# Patient Record
Sex: Female | Born: 1980 | Race: White | Hispanic: No | Marital: Married | Smoking: Current every day smoker
Health system: Southern US, Community
[De-identification: ages and names within clinical notes are randomized; demographics above are authoritative.]

## PROBLEM LIST (undated history)

## (undated) DIAGNOSIS — Z803 Family history of malignant neoplasm of breast: Secondary | ICD-10-CM

## (undated) DIAGNOSIS — N920 Excessive and frequent menstruation with regular cycle: Secondary | ICD-10-CM

## (undated) DIAGNOSIS — F988 Other specified behavioral and emotional disorders with onset usually occurring in childhood and adolescence: Secondary | ICD-10-CM

## (undated) HISTORY — DX: Family history of malignant neoplasm of breast: Z80.3

## (undated) HISTORY — PX: LASIK: SHX215

## (undated) HISTORY — DX: Other specified behavioral and emotional disorders with onset usually occurring in childhood and adolescence: F98.8

## (undated) HISTORY — DX: Excessive and frequent menstruation with regular cycle: N92.0

## (undated) HISTORY — PX: TUBAL LIGATION: SHX77

---

## 2005-01-04 ENCOUNTER — Inpatient Hospital Stay: Payer: Self-pay | Admitting: Unknown Physician Specialty

## 2007-04-08 ENCOUNTER — Ambulatory Visit: Payer: Self-pay

## 2007-04-08 HISTORY — PX: DILATION AND CURETTAGE OF UTERUS: SHX78

## 2008-11-29 ENCOUNTER — Ambulatory Visit: Payer: Self-pay | Admitting: Obstetrics & Gynecology

## 2008-11-30 ENCOUNTER — Inpatient Hospital Stay: Payer: Self-pay | Admitting: Obstetrics & Gynecology

## 2016-07-03 LAB — HM PAP SMEAR: HM PAP: NEGATIVE

## 2017-02-22 ENCOUNTER — Ambulatory Visit (INDEPENDENT_AMBULATORY_CARE_PROVIDER_SITE_OTHER): Payer: BC Managed Care – PPO | Admitting: Obstetrics & Gynecology

## 2017-02-22 ENCOUNTER — Encounter: Payer: Self-pay | Admitting: Obstetrics & Gynecology

## 2017-02-22 VITALS — BP 102/70 | HR 75 | Ht 63.0 in | Wt 129.0 lb

## 2017-02-22 DIAGNOSIS — N920 Excessive and frequent menstruation with regular cycle: Secondary | ICD-10-CM | POA: Diagnosis not present

## 2017-02-22 NOTE — Patient Instructions (Signed)
Endometrial Ablation Endometrial ablation is a procedure that destroys the thin inner layer of the lining of the uterus (endometrium). This procedure may be done:  To stop heavy periods.  To stop bleeding that is causing anemia.  To control irregular bleeding.  To treat bleeding caused by small tumors (fibroids) in the endometrium.  This procedure is often an alternative to major surgery, such as removal of the uterus and cervix (hysterectomy). As a result of this procedure:  You may not be able to have children. However, if you are premenopausal (you have not gone through menopause): ? You may still have a small chance of getting pregnant. ? You will need to use a reliable method of birth control after the procedure to prevent pregnancy.  You may stop having a menstrual period, or you may have only a small amount of bleeding during your period. Menstruation may return several years after the procedure.  Tell a health care provider about:  Any allergies you have.  All medicines you are taking, including vitamins, herbs, eye drops, creams, and over-the-counter medicines.  Any problems you or family members have had with the use of anesthetic medicines.  Any blood disorders you have.  Any surgeries you have had.  Any medical conditions you have. What are the risks? Generally, this is a safe procedure. However, problems may occur, including:  A hole (perforation) in the uterus or bowel.  Infection of the uterus, bladder, or vagina.  Bleeding.  Damage to other structures or organs.  An air bubble in the lung (air embolus).  Problems with pregnancy after the procedure.  Failure of the procedure.  Decreased ability to diagnose cancer in the endometrium.  What happens before the procedure?  You will have tests of your endometrium to make sure there are no pre-cancerous cells or cancer cells present.  You may have an ultrasound of the uterus.  You may be given  medicines to thin the endometrium.  Ask your health care provider about: ? Changing or stopping your regular medicines. This is especially important if you take diabetes medicines or blood thinners. ? Taking medicines such as aspirin and ibuprofen. These medicines can thin your blood. Do not take these medicines before your procedure if your doctor tells you not to.  Plan to have someone take you home from the hospital or clinic. What happens during the procedure?  You will lie on an exam table with your feet and legs supported as in a pelvic exam.  To lower your risk of infection: ? Your health care team will wash or sanitize their hands and put on germ-free (sterile) gloves. ? Your genital area will be washed with soap.  An IV tube will be inserted into one of your veins.  You will be given a medicine to help you relax (sedative).  A surgical instrument with a light and camera (resectoscope) will be inserted into your vagina and moved into your uterus. This allows your surgeon to see inside your uterus.  Endometrial tissue will be removed using one of the following methods: ? Radiofrequency. This method uses a radiofrequency-alternating electric current to remove the endometrium. ? Cryotherapy. This method uses extreme cold to freeze the endometrium. ? Heated-free liquid. This method uses a heated saltwater (saline) solution to remove the endometrium. ? Microwave. This method uses high-energy microwaves to heat up the endometrium and remove it. ? Thermal balloon. This method involves inserting a catheter with a balloon tip into the uterus. The balloon tip is   filled with heated fluid to remove the endometrium. The procedure may vary among health care providers and hospitals. What happens after the procedure?  Your blood pressure, heart rate, breathing rate, and blood oxygen level will be monitored until the medicines you were given have worn off.  As tissue healing occurs, you may  notice vaginal bleeding for 4-6 weeks after the procedure. You may also experience: ? Cramps. ? Thin, watery vaginal discharge that is light pink or brown in color. ? A need to urinate more frequently than usual. ? Nausea.  Do not drive for 24 hours if you were given a sedative.  Do not have sex or insert anything into your vagina until your health care provider approves. Summary  Endometrial ablation is done to treat the many causes of heavy menstrual bleeding.  The procedure may be done only after medications have been tried to control the bleeding.  Plan to have someone take you home from the hospital or clinic. This information is not intended to replace advice given to you by your health care provider. Make sure you discuss any questions you have with your health care provider. Document Released: 09/28/2004 Document Revised: 12/06/2016 Document Reviewed: 12/06/2016 Elsevier Interactive Patient Education  2017 Elsevier Inc.  

## 2017-02-22 NOTE — Progress Notes (Signed)
Subjective:     Brandi Valdez is an 36 y.o. woman who presents for heavy regular menses. She had been bleeding regularly. She is now bleeding every 28 days and menses are lasting 5 days. She changes her pad or tampon every 1 hours. Clots are 4-8 cm in size. Dysmenorrhea:mild, occurring first 1-2 days of flow. Cyclic symptoms include: none. Current contraception: tubal ligation. History of infertility: no. History of abnormal Pap smear: no.  Menstrual History: OB History    Gravida Para Term Preterm AB Living   SAB TAB Ectopic Multiple Live Births                  Patient's last menstrual period was 02/17/2017. Period Cycle (Days): 28 Period Duration (Days): 8 Period Pattern: Regular Menstrual Flow: Heavy Dysmenorrhea: (!) Mild Dysmenorrhea Symptoms: Other (Comment)  The following portions of the patient's history were reviewed and updated as appropriate: allergies, current medications, past family history, past medical history, past social history, past surgical history and problem list.  Review of Systems Pertinent items noted in HPI and remainder of comprehensive ROS otherwise negative.    Objective:    BP 102/70   Pulse 75   Ht  (1.6 m)   Wt 129 lb (58.5 kg)   LMP 02/17/2017   BMI 22.85 kg/m     Physical Exam  Constitutional: She is oriented to person, place, and time. She appears well-developed and well-nourished. No distress.  Musculoskeletal: Normal range of motion.  Neurological: She is alert and oriented to person, place, and time.  Skin: Skin is warm and dry.  Psychiatric: She has a normal mood and affect.  Vitals reviewed.   Assessment:    menorrhagia    Plan:   Patient has abnormal uterine bleeding . She has a normal exam today, with no evidence of lesions.  Evaluation includes the following: exam, labs such as hormonal testing, and pelvic ultrasound to evaluate for any structural gynecologic abnormalities.  Patient to follow up  after testing.  Treatment option for menorrhagia or menometrorrhagia discussed in great detail with the patient.  Options include hormonal therapy, IUD therapy such as Mirena, D&C, Ablation, and Hysterectomy.  The pros and cons of each option discussed with patient.  Pt prefers ablation unless abnormal US guides [place] otherwise.

## 2017-02-27 ENCOUNTER — Ambulatory Visit (INDEPENDENT_AMBULATORY_CARE_PROVIDER_SITE_OTHER): Payer: BC Managed Care – PPO | Admitting: Obstetrics & Gynecology

## 2017-02-27 ENCOUNTER — Ambulatory Visit (INDEPENDENT_AMBULATORY_CARE_PROVIDER_SITE_OTHER): Payer: BC Managed Care – PPO

## 2017-02-27 ENCOUNTER — Encounter: Payer: Self-pay | Admitting: Obstetrics & Gynecology

## 2017-02-27 VITALS — BP 110/70 | HR 87 | Ht 63.0 in | Wt 129.0 lb

## 2017-02-27 DIAGNOSIS — N920 Excessive and frequent menstruation with regular cycle: Secondary | ICD-10-CM

## 2017-02-27 NOTE — Patient Instructions (Signed)
Endometrial Ablation Endometrial ablation is a procedure that destroys the thin inner layer of the lining of the uterus (endometrium). This procedure may be done:  To stop heavy periods.  To stop bleeding that is causing anemia.  To control irregular bleeding.  To treat bleeding caused by small tumors (fibroids) in the endometrium.  This procedure is often an alternative to major surgery, such as removal of the uterus and cervix (hysterectomy). As a result of this procedure:  You may not be able to have children. However, if you are premenopausal (you have not gone through menopause): ? You may still have a small chance of getting pregnant. ? You will need to use a reliable method of birth control after the procedure to prevent pregnancy.  You may stop having a menstrual period, or you may have only a small amount of bleeding during your period. Menstruation may return several years after the procedure.  Tell a health care provider about:  Any allergies you have.  All medicines you are taking, including vitamins, herbs, eye drops, creams, and over-the-counter medicines.  Any problems you or family members have had with the use of anesthetic medicines.  Any blood disorders you have.  Any surgeries you have had.  Any medical conditions you have. What are the risks? Generally, this is a safe procedure. However, problems may occur, including:  A hole (perforation) in the uterus or bowel.  Infection of the uterus, bladder, or vagina.  Bleeding.  Damage to other structures or organs.  An air bubble in the lung (air embolus).  Problems with pregnancy after the procedure.  Failure of the procedure.  Decreased ability to diagnose cancer in the endometrium.  What happens before the procedure?  You will have tests of your endometrium to make sure there are no pre-cancerous cells or cancer cells present.  You may have an ultrasound of the uterus.  You may be given  medicines to thin the endometrium.  Ask your health care provider about: ? Changing or stopping your regular medicines. This is especially important if you take diabetes medicines or blood thinners. ? Taking medicines such as aspirin and ibuprofen. These medicines can thin your blood. Do not take these medicines before your procedure if your doctor tells you not to.  Plan to have someone take you home from the hospital or clinic. What happens during the procedure?  You will lie on an exam table with your feet and legs supported as in a pelvic exam.  To lower your risk of infection: ? Your health care team will wash or sanitize their hands and put on germ-free (sterile) gloves. ? Your genital area will be washed with soap.  An IV tube will be inserted into one of your veins.  You will be given a medicine to help you relax (sedative).  A surgical instrument with a light and camera (resectoscope) will be inserted into your vagina and moved into your uterus. This allows your surgeon to see inside your uterus.  Endometrial tissue will be removed using one of the following methods: ? Radiofrequency. This method uses a radiofrequency-alternating electric current to remove the endometrium. ? Cryotherapy. This method uses extreme cold to freeze the endometrium. ? Heated-free liquid. This method uses a heated saltwater (saline) solution to remove the endometrium. ? Microwave. This method uses high-energy microwaves to heat up the endometrium and remove it. ? Thermal balloon. This method involves inserting a catheter with a balloon tip into the uterus. The balloon tip is   filled with heated fluid to remove the endometrium. The procedure may vary among health care providers and hospitals. What happens after the procedure?  Your blood pressure, heart rate, breathing rate, and blood oxygen level will be monitored until the medicines you were given have worn off.  As tissue healing occurs, you may  notice vaginal bleeding for 4-6 weeks after the procedure. You may also experience: ? Cramps. ? Thin, watery vaginal discharge that is light pink or brown in color. ? A need to urinate more frequently than usual. ? Nausea.  Do not drive for 24 hours if you were given a sedative.  Do not have sex or insert anything into your vagina until your health care provider approves. Summary  Endometrial ablation is done to treat the many causes of heavy menstrual bleeding.  The procedure may be done only after medications have been tried to control the bleeding.  Plan to have someone take you home from the hospital or clinic. This information is not intended to replace advice given to you by your health care provider. Make sure you discuss any questions you have with your health care provider. Document Released: 09/28/2004 Document Revised: 12/06/2016 Document Reviewed: 12/06/2016 Elsevier Interactive Patient Education  2017 Elsevier Inc.  

## 2017-02-27 NOTE — Progress Notes (Signed)
  HPI: Brandi Valdez is an 36 y.o. woman who presents for heavy regular Brandi Valdez. She had been bleeding regularly. She is now bleeding every 28 days and menses are lasting 5 days. She changes her pad or tampon every 1 hours. Clots are 4-8 cm in size. Dysmenorrhea:mild, occurring first 1-2 days of flow. Cyclic symptoms include: none. Current contraception: tubal ligation.  Ultrasound demonstrates no masses seen, normal lining of uterus These findings are Pelvis normal  PMHx: She  has no past medical history on file. Also,  has a past surgical history that includes Cesarean section and Tubal ligation., family history includes Breast cancer in her mother and paternal grandmother; Colon cancer in her maternal grandmother; Lymphoma in her paternal aunt.,  reports that she has been smoking.  She has never used smokeless tobacco. She reports that she does not drink alcohol or use drugs.  She has a current medication list which includes the following prescription(s): naproxen. Also, has No Known Allergies.  Review of Systems  Constitutional: Negative for chills, fever and malaise/fatigue.  HENT: Negative for congestion, sinus pain and sore throat.   Eyes: Negative for blurred vision and pain.  Respiratory: Negative for cough and wheezing.   Cardiovascular: Negative for chest pain and leg swelling.  Gastrointestinal: Negative for abdominal pain, constipation, diarrhea, heartburn, nausea and vomiting.  Genitourinary: Negative for dysuria, frequency, hematuria and urgency.  Musculoskeletal: Negative for back pain, joint pain, myalgias and neck pain.  Skin: Negative for itching and rash.  Neurological: Negative for dizziness, tremors and weakness.  Endo/Heme/Allergies: Does not bruise/bleed easily.  Psychiatric/Behavioral: Negative for depression. The patient is not nervous/anxious and does not have insomnia.     Objective: BP 110/70   Pulse 87   Ht 5\' 3"  (1.6 m)   Wt 129 lb (58.5 kg)   LMP  02/17/2017   BMI 22.85 kg/m   Physical examination Constitutional NAD, Conversant  Skin No rashes, lesions or ulceration.   Extremities: Moves all appropriately.  Normal ROM for age. No lymphadenopathy.  Neuro: Grossly intact  Psych: Oriented to PPT.  Normal mood. Normal affect.    Endometrial Biopsy After discussion with the patient regarding her abnormal uterine bleeding I recommended that she proceed with an endometrial biopsy for further diagnosis. The risks, benefits, alternatives, and indications for an endometrial biopsy were discussed with the patient in detail. She understood the risks including infection, bleeding, cervical laceration and uterine perforation.  Verbal consent was obtained.   PROCEDURE NOTE:  Pipelle endometrial biopsy was performed using aseptic technique with iodine preparation.  The uterus was sounded to a length of 7 cm.  Adequate sampling was obtained with minimal blood loss.  The patient tolerated the procedure well.  Disposition will be pending pathology.  Assessment:  Menorrhagia with regular cycle - Plan: Pathology EMB, desires endometrial ablation Patient was told that it is normal to have menstrual bleeding after an endometrial ablation, only about 40% of patients become amenorrheic, 50% of patients have normal or light periods, and 10% of patients have no change in their bleeding pattern and may need further intervention.  She was told she will observe her periods for a few months after her ablation to see what her periods will be like; it is recommended to wait until at least three months after the procedure before making conclusions about how periods are going to be like after an ablation.  Annamarie MajorPaul Pascale Maves, MD, Merlinda FrederickFACOG [facility], [facility] 02/27/2017  5:24 PM

## 2017-03-04 ENCOUNTER — Telehealth: Payer: Self-pay | Admitting: Obstetrics & Gynecology

## 2017-03-04 LAB — PATHOLOGY

## 2017-03-04 NOTE — Telephone Encounter (Signed)
Per [employer], $16 copay then 100% with or without office visit. Deductible, coinsurance and out of pocked are not applicable, no auth required for [id number].Marland Kitchen Ref# [id number]. I spoke to the patient to let her know that the [employer] representative said she would owe $94, and that if the representative is incorrect that they would pay according to the plan. Patient understands.

## 2017-03-04 NOTE — Telephone Encounter (Signed)
Patient is aware of in-office procedure scheduled on 03/22/17 @ 10:50am.

## 2017-03-04 NOTE — Telephone Encounter (Signed)
-----   Message from Nadara Mustard, MD sent at 02/27/2017  5:08 PM EDT ----- Regarding: In Office Procedure Apr 20 Surgery Booking Request Patient Full Name: Brandi Valdez  MRN: [id number]  DOB: [birth year]  Surgeon: Letitia Libra, MD  Requested Surgery Date and Time: APR 20 IN OFFICE Primary Diagnosis and Code: Menorrhagia Secondary Diagnosis and Code: no Surgical Procedure: IN OFFICE NOVASURE ENDOMETRIAL ABLATION No H&P needed, consens today

## 2017-03-21 DIAGNOSIS — F17200 Nicotine dependence, unspecified, uncomplicated: Secondary | ICD-10-CM | POA: Insufficient documentation

## 2017-03-22 ENCOUNTER — Ambulatory Visit (INDEPENDENT_AMBULATORY_CARE_PROVIDER_SITE_OTHER): Payer: BC Managed Care – PPO

## 2017-03-22 ENCOUNTER — Ambulatory Visit (INDEPENDENT_AMBULATORY_CARE_PROVIDER_SITE_OTHER): Payer: BC Managed Care – PPO | Admitting: Obstetrics & Gynecology

## 2017-03-22 ENCOUNTER — Encounter: Payer: Self-pay | Admitting: Obstetrics & Gynecology

## 2017-03-22 VITALS — BP 100/60 | HR 96 | Ht 63.0 in | Wt 130.0 lb

## 2017-03-22 DIAGNOSIS — N920 Excessive and frequent menstruation with regular cycle: Secondary | ICD-10-CM | POA: Diagnosis not present

## 2017-03-22 HISTORY — PX: HYSTEROSCOPY W/ ENDOMETRIAL ABLATION: SUR665

## 2017-03-22 NOTE — Progress Notes (Signed)
Procedure Note   03/22/2017  PRE-OP DIAGNOSIS: Menorrhagia   POST-OP DIAGNOSIS: same   SURGEON: Annamarie Major, MD, FACOG   PROCEDURE: Hysteroscopy D&C, Endometrial Ablation  ANESTHESIA: Local with Valium/Demerol relaxation   ESTIMATED BLOOD LOSS: min   SPECIMENS: none  FLUID DEFICIT: min   COMPLICATIONS: none   DISPOSITION: PACU - hemodynamically stable.   CONDITION: stable   FINDINGS: Exam under anesthesia revealed small, mobile small uterus with no masses and bilateral adnexa without masses or fullness. Hysteroscopy revealed grossly normal appearing uterine cavity with bilateral tubal ostia and normal appearing endocervical canal.  PROCEDURE IN DETAIL: After informed consent was obtained, the patient was taken to the procedure room. The patient was positioned in the dorsal lithotomy position in Dexter stirrups. The patient was examined under anesthesia, with the above noted findings. The weighted speculum was placed inside the patient's vagina, and Lidocaine 1% w epi is used to infiltrate the 2,4,8, and 10 o'clock positions of the cervix (20 mL total).   The  anterior lip of the cervix was seen and grasped with the tenaculum.  The uterine cavity was sounded to 7cm.The cervix was progressively dilated to a 12 French-Pratt dilator. The 30 degree hysteroscope was introduced, with LR fluid used to distend the intrauterine cavity, with the above noted findings. Hysteroscope then removed.   The NovaSure device was then placed without difficulty. Measurements were obtained. Patient was noted to have a uterine length of 7.5cm, a cervical length of 2.5 cm, and a cervical width of  3.8 cm. The NovaSure device is first tested and after confirmation the procedures performed. Length of procedure was 117 seconds. Power 105. The NovaSure device is then removed.    Tenaculum was removed with excellent hemostasis noted. She was then taken out of dorsal lithotomy. Minimal discrepancy in fluid was noted.  No bleeding.  The patient tolerated the procedure well. Sponge, lap and needle counts were correct x2. The patient was then monitored and found to recover in excellent condition.  Annamarie Major, MD, [name] Ob/[facility], [facility] 03/22/2017  11:43 AM

## 2017-03-22 NOTE — Patient Instructions (Signed)
General Gynecological Post-Operative Instructions °You may expect to feel dizzy, weak, and drowsy for as long as 24 hours after receiving the medicine that made you sleep (anesthetic).  °Do not drive a car, ride a bicycle, participate in physical activities, or take public transportation until you are done taking narcotic pain medicines or as directed by your doctor.  °Do not drink alcohol or take tranquilizers.  °Do not take medicine that has not been prescribed by your doctor.  °Do not sign important papers or make important decisions while on narcotic pain medicines.  °Have a responsible person with you.  °CARE OF INCISION  °Keep incision clean and dry. °Take showers instead of baths until your doctor gives you permission to take baths.  °Avoid heavy lifting (more than 10 pounds/4.5 kilograms), pushing, or pulling.  °Avoid activities that may risk injury to your surgical site.  °No sexual intercourse or placement of anything in the vagina for 2 weeks or as instructed by your doctor. °If you have tubes coming from the wound site, check with your doctor regarding appropriate care of the tubes. °Only take prescription or over-the-counter medicines  for pain, discomfort, or fever as directed by your doctor. Do not take aspirin. It can make you bleed. Take medicines (antibiotics) that kill germs if they are prescribed for you.  °Call the office or go to the ER if:  °You feel sick to your stomach (nauseous) and you start to throw up (vomit).  °You have trouble eating or drinking.  °You have an oral temperature above 101.  °You have constipation that is not helped by adjusting diet or increasing fluid intake. Pain medicines are a common cause of constipation.  °You have any other concerns. °SEEK IMMEDIATE MEDICAL CARE IF:  °You have persistent dizziness.  °You have difficulty breathing or a congested sounding (croupy) cough.  °You have an oral temperature above 102.5, not controlled by medicine.  °There is increasing  pain or tenderness near or in the surgical site.  ° ° ° °

## 2017-04-16 ENCOUNTER — Encounter: Payer: Self-pay | Admitting: Obstetrics & Gynecology

## 2017-04-16 ENCOUNTER — Ambulatory Visit (INDEPENDENT_AMBULATORY_CARE_PROVIDER_SITE_OTHER): Payer: BC Managed Care – PPO | Admitting: Obstetrics & Gynecology

## 2017-04-16 VITALS — BP 100/60 | HR 78 | Ht 63.0 in | Wt 132.0 lb

## 2017-04-16 DIAGNOSIS — N921 Excessive and frequent menstruation with irregular cycle: Secondary | ICD-10-CM

## 2017-04-16 NOTE — Progress Notes (Signed)
  Postoperative Follow-up Patient presents post op from ablation for abnormal uterine bleeding, 3 weeks ago.  Subjective: Patient reports marked improvement in her preop symptoms. Eating a regular diet without difficulty. The patient is not having any pain.  Activity: normal activities of daily living. Patient reports vaginal sx's of None  Objective: BP 100/60   Pulse 78   Ht 5\' 3"  (1.6 m)   Wt 132 lb (59.9 kg)   BMI 23.38 kg/m  Physical Exam  Constitutional: She is oriented to person, place, and time. She appears well-developed and well-nourished. No distress.  Musculoskeletal: Normal range of motion.  Neurological: She is alert and oriented to person, place, and time.  Skin: Skin is warm and dry.  Psychiatric: She has a normal mood and affect.  Vitals reviewed.  Assessment: s/p :  endometrial ablation stable  Plan: Patient has done well after surgery with no apparent complications.  I have discussed the post-operative course to date, and the expected progress moving forward.  The patient understands what complications to be concerned about.  I will see the patient in routine follow up, or sooner if needed.    Activity plan: No restriction.  Letitia Libraobert Paul Felicha Frayne 04/16/2017, 4:02 PM

## 2017-04-17 ENCOUNTER — Ambulatory Visit: Payer: BC Managed Care – PPO | Admitting: Obstetrics & Gynecology

## 2018-04-11 ENCOUNTER — Encounter: Payer: Self-pay | Admitting: Obstetrics & Gynecology

## 2018-05-22 ENCOUNTER — Encounter: Payer: Self-pay | Admitting: Certified Nurse Midwife

## 2018-05-22 ENCOUNTER — Ambulatory Visit (INDEPENDENT_AMBULATORY_CARE_PROVIDER_SITE_OTHER): Payer: BC Managed Care – PPO | Admitting: Certified Nurse Midwife

## 2018-05-22 VITALS — BP 108/72 | HR 89 | Ht 62.0 in | Wt 125.0 lb

## 2018-05-22 DIAGNOSIS — Z124 Encounter for screening for malignant neoplasm of cervix: Secondary | ICD-10-CM

## 2018-05-22 DIAGNOSIS — Z803 Family history of malignant neoplasm of breast: Secondary | ICD-10-CM

## 2018-05-22 DIAGNOSIS — F17210 Nicotine dependence, cigarettes, uncomplicated: Secondary | ICD-10-CM | POA: Diagnosis not present

## 2018-05-22 DIAGNOSIS — M549 Dorsalgia, unspecified: Secondary | ICD-10-CM

## 2018-05-22 DIAGNOSIS — Z01419 Encounter for gynecological examination (general) (routine) without abnormal findings: Secondary | ICD-10-CM

## 2018-05-22 DIAGNOSIS — Z01411 Encounter for gynecological examination (general) (routine) with abnormal findings: Secondary | ICD-10-CM | POA: Diagnosis not present

## 2018-05-22 DIAGNOSIS — F17200 Nicotine dependence, unspecified, uncomplicated: Secondary | ICD-10-CM

## 2018-05-22 NOTE — Patient Instructions (Signed)
Coping with Quitting Smoking Quitting smoking is a physical and mental challenge. You will face cravings, withdrawal symptoms, and temptation. Before quitting, work with your health care provider to make a plan that can help you cope. Preparation can help you quit and keep you from giving in. How can I cope with cravings? Cravings usually last for 5-10 minutes. If you get through it, the craving will pass. Consider taking the following actions to help you cope with cravings:  Keep your mouth busy: ? Chew sugar-free gum. ? Suck on hard candies or a straw. ? Brush your teeth.  Keep your hands and body busy: ? Immediately change to a different activity when you feel a craving. ? Squeeze or play with a ball. ? Do an activity or a hobby, like making bead jewelry, practicing needlepoint, or working with wood. ? Mix up your normal routine. ? Take a short exercise break. Go for a quick walk or run up and down stairs. ? Spend time in public places where smoking is not allowed.  Focus on doing something kind or helpful for someone else.  Call a friend or family member to talk during a craving.  Join a support group.  Call a quit line, such as 1-800-QUIT-NOW.  Talk with your health care provider about medicines that might help you cope with cravings and make quitting easier for you.  How can I deal with withdrawal symptoms? Your body may experience negative effects as it tries to get used to not having nicotine in the system. These effects are called withdrawal symptoms. They may include:  Feeling hungrier than normal.  Trouble concentrating.  Irritability.  Trouble sleeping.  Feeling depressed.  Restlessness and agitation.  Craving a cigarette.  To manage withdrawal symptoms:  Avoid places, people, and activities that trigger your cravings.  Remember why you want to quit.  Get plenty of sleep.  Avoid coffee and other caffeinated drinks. These may worsen some of your  symptoms.  How can I handle social situations? Social situations can be difficult when you are quitting smoking, especially in the first few weeks. To manage this, you can:  Avoid parties, bars, and other social situations where people might be smoking.  Avoid alcohol.  Leave right away if you have the urge to smoke.  Explain to your family and friends that you are quitting smoking. Ask for understanding and support.  Plan activities with friends or family where smoking is not an option.  What are some ways I can cope with stress? Wanting to smoke may cause stress, and stress can make you want to smoke. Find ways to manage your stress. Relaxation techniques can help. For example:  Breathe slowly and deeply, in through your nose and out through your mouth.  Listen to soothing, relaxing music.  Talk with a family member or friend about your stress.  Light a candle.  Soak in a bath or take a shower.  Think about a peaceful place.  What are some ways I can prevent weight gain? Be aware that many people gain weight after they quit smoking. However, not everyone does. To keep from gaining weight, have a plan in place before you quit and stick to the plan after you quit. Your plan should include:  Having healthy snacks. When you have a craving, it may help to: ? Eat plain popcorn, crunchy carrots, celery, or other cut vegetables. ? Chew sugar-free gum.  Changing how you eat: ? Eat small portion sizes at meals. ?   Eat 4-6 small meals throughout the day instead of 1-2 large meals a day. ? Be mindful when you eat. Do not watch television or do other things that might distract you as you eat.  Exercising regularly: ? Make time to exercise each day. If you do not have time for a long workout, do short bouts of exercise for 5-10 minutes several times a day. ? Do some form of strengthening exercise, like weight lifting, and some form of aerobic exercise, like running or  swimming.  Drinking plenty of water or other low-calorie or no-calorie drinks. Drink 6-8 glasses of water daily, or as much as instructed by your health care provider.  Summary  Quitting smoking is a physical and mental challenge. You will face cravings, withdrawal symptoms, and temptation to smoke again. Preparation can help you as you go through these challenges.  You can cope with cravings by keeping your mouth busy (such as by chewing gum), keeping your body and hands busy, and making calls to family, friends, or a helpline for people who want to quit smoking.  You can cope with withdrawal symptoms by avoiding places where people smoke, avoiding drinks with caffeine, and getting plenty of rest.  Ask your health care provider about the different ways to prevent weight gain, avoid stress, and handle social situations. This information is not intended to replace advice given to you by your health care provider. Make sure you discuss any questions you have with your health care provider. Document Released: 11/16/2016 Document Revised: 11/16/2016 Document Reviewed: 11/16/2016 Elsevier Interactive Patient Education  2018 [employer] Inc.  

## 2018-05-22 NOTE — Progress Notes (Signed)
Gynecology Annual Exam  PCP: [facility]  Chief Complaint:  Chief Complaint  Patient presents with  . Gynecologic Exam    History of Present Illness:Brandi Valdez presents today for her annual exam. She is a 37 year old Caucasian/White female, G5 P3023, whose LMP was 03/14/2017. She has been amenorrheic since her Novasure ablation 03/22/2017 for menorrhagia. She has had no spotting.  She reports back pain occasionally. She does not use any medication or heating pad.  The patient's past medical history is notable for a history of three C-sections and a history of eczema.  Since her last annual GYN exam dated 07/03/2016, she has had no other significant changes in her health history.  She is sexually active. She is currently using permanent sterilization for contraception.  Her most recent pap smear was obtained 07/03/2016 and was NIL/neg HRHPV Mammogram is not applicable.  There is a positive history of breast cancer in her mother and at age 38.Marland Kitchen. Genetic testing has not been done.  There is no family history of ovarian cancer.  The patient does do occasional self breast exams.  The patient smokes 1/2 ppd. She did stop smoking with her last pregnancy then began smoking 2 years after her last pregnancy. The patient does not drink alcohol.  The patient does not use illegal drugs.  The patient does not exercise.  The patient may not get adequate calcium in her diet.  She had a recent cholesterol screen in 2015 by her PCP that was normal.     Review of Systems: Review of Systems  Constitutional: Negative for chills, fever and weight loss.  HENT: Negative for congestion, sinus pain and sore throat.   Eyes: Negative for blurred vision and pain.  Respiratory: Negative for hemoptysis, shortness of breath and wheezing.   Cardiovascular: Negative for chest pain, palpitations and leg swelling.  Gastrointestinal: Negative for abdominal pain, blood in stool, diarrhea,  heartburn, nausea and vomiting.  Genitourinary: Negative for dysuria, frequency, hematuria and urgency.  Musculoskeletal: Negative for back pain, joint pain and myalgias.  Skin: Positive for itching (under breasts during winter). Negative for rash.  Neurological: Negative for dizziness, tingling and headaches.  Endo/Heme/Allergies: Positive for environmental allergies. Negative for polydipsia. Does not bruise/bleed easily.       Negative for hirsutism   Psychiatric/Behavioral: Negative for depression. The patient is not nervous/anxious and does not have insomnia.     Past Medical History:  Past Medical History:  Diagnosis Date  . Family history of breast cancer   . Menorrhagia with regular cycle     Past Surgical History:  Past Surgical History:  Procedure Laterality Date  . CESAREAN SECTION    . DILATION AND CURETTAGE OF UTERUS  04/08/2007   missed AB  . HYSTEROSCOPY W/ ENDOMETRIAL ABLATION  03/22/2017   with Novasure-Dr Tiburcio PeaHarris  . LASIK    . TUBAL LIGATION      Family History:  Family History  Problem Relation Age of Onset  . Breast cancer Mother 5450  . Hypertension Mother   . Lymphoma Paternal Aunt   . Colon cancer Maternal Grandmother 90  . Breast cancer Paternal Grandmother     Social History:  Social History   Socioeconomic History  . Marital status: Married    Spouse name: Not on file  . Number of children: 3  . Years of education: Not on file  . Highest education level: Not on file  Occupational History  . Occupation: Runner, broadcasting/film/videoTeacher  Social Needs  . Financial resource strain: Not on file  . Food insecurity:    Worry: Not on file    Inability: Not on file  . Transportation needs:    Medical: Not on file    Non-medical: Not on file  Tobacco Use  . Smoking status: Current Every Day Smoker    Packs/day: 0.50    Types: Cigarettes  . Smokeless tobacco: Never Used  Substance and Sexual Activity  . Alcohol use: No  . Drug use: No  . Sexual activity: Yes     Birth control/protection: Surgical  Lifestyle  . Physical activity:    Days per week: 0 days    Minutes per session: 0 min  . Stress: Only a little  Relationships  . Social connections:    Talks on phone: Not on file    Gets together: Not on file    Attends religious service: Not on file    Active member of club or organization: Not on file    Attends meetings of clubs or organizations: Not on file    Relationship status: Not on file  . Intimate partner violence:    Fear of current or ex partner: Not on file    Emotionally abused: Not on file    Physically abused: Not on file    Forced sexual activity: Not on file  Other Topics Concern  . Not on file  Social History Narrative  . Not on file    Allergies:  No Known Allergies  Medications:  No current outpatient medications on file prior to visit.   No current facility-administered medications on file prior to visit.     Physical Exam Vitals: BP 108/72   Pulse 89   Ht 5\' 2"  (1.575 m)   Wt 125 lb (56.7 kg)   BMI 22.86 kg/m   General: NAD HEENT: normocephalic, anicteric Neck: no thyroid enlargement, no palpable nodules, no cervical lymphadenopathy  Pulmonary: No increased work of breathing, CTAB Cardiovascular: RRR, without murmur  Breast: Breast symmetrical, no tenderness, no palpable nodules or masses, no skin or nipple retraction present, no nipple discharge.  No axillary, infraclavicular or supraclavicular lymphadenopathy. Abdomen: Soft, non-tender, non-distended.  Umbilicus without lesions.  No hepatomegaly or masses palpable. No evidence of hernia. Genitourinary:  External: Normal external female genitalia.  Normal urethral meatus, normal  Bartholin's and Skene's glands.    Vagina: Normal vaginal mucosa, no evidence of prolapse.    Cervix: Grossly normal in appearance, no bleeding, non-tender  Uterus: Retroflexed, normal size, shape, and consistency, decreased mobility, and non-tender  Adnexa: No adnexal  masses, non-tender  Rectal: deferred  Lymphatic: no evidence of inguinal lymphadenopathy Extremities: no edema, erythema, or tenderness Neurologic: Grossly intact Psychiatric: mood appropriate, affect full     Assessment: 37 y.o. [id number] well woman exam Tobacco use  Plan:   1) Breast cancer screening - recommend monthly self breast exam and annual mammograms starting at age 63.  Marland Kitchen 2) Cervical cancer screening - Pap was done.   3) Routine healthcare maintenance including cholesterol and diabetes screening not due. Discussed calcium and vitamin D3 requirements.  4)Discussed smoking cessation  5) RTO in 1 year and prn  Farrel Conners, CNM

## 2018-05-26 LAB — IGP,RFX APTIMA HPV ALL PTH: PAP Smear Comment: 0

## 2018-05-31 ENCOUNTER — Encounter: Payer: Self-pay | Admitting: Certified Nurse Midwife

## 2019-06-14 NOTE — Progress Notes (Addendum)
Gynecology Annual Exam  PCP: [facility]  Chief Complaint:  Chief Complaint  Patient presents with  . Gynecologic Exam    shoulder and rectum pain for the last three weeks    History of Present Illness:Brandi Valdez presents today for her annual exam. She is a 38 year old Caucasian/White female, G5 P3023, whose LMP was 03/22/17. She has been amenorrheic since her Novasure ablation 03/22/2017 for menorrhagia. She has had no spotting.  She also reports having concerns regarding right shoulder pain and pain in her right upper arm made worse by abducting her arm x 1 month. Her ROM is limited by the pain.  She denies any injury to her shoulder or upper arm. She has started cleaning her husband's office 3 times a week. Advil 400 mgm seems to relieve the pain all day. Another concern is rectal pain. She first noticed this while riding in the car. The pain is burning and feels bruised. The pain is intermittent and she notices it mostly when sitting. She denies any blood in her stool or any pain with defecation. Denies straining to have a BM. The patient's past medical history is notable for a history of three C-sections and a history of eczema.  Since her last annual GYN exam dated 05/22/2018, she has had no other significant changes in her health history.  She is sexually active. She is currently using permanent sterilization for contraception.  Her most recent pap smear was obtained 05/22/2018 and was NIL. Mammogram is not applicable.  There is a positive history of breast cancer in her mother and at age 39.Marland Kitchen. Genetic testing has not been done.  There is no family history of ovarian cancer.  The patient does do occasional self breast exams.  The patient smokes 1/2 ppd. She did stop smoking with her last pregnancy then began smoking 2 years after her last pregnancy. The patient does not drink alcohol.  The patient does not use illegal drugs.  The patient does not exercise  other than cleaning 3 days a week. The patient may not get adequate calcium in her diet.  She had a recent cholesterol screen in 2015 by her PCP that was normal.     Review of Systems: Review of Systems  Constitutional: Negative for chills, fever and weight loss.  HENT: Negative for congestion, sinus pain and sore throat.   Eyes: Negative for blurred vision and pain.  Respiratory: Negative for hemoptysis, shortness of breath and wheezing.   Cardiovascular: Negative for chest pain, palpitations and leg swelling.  Gastrointestinal: Negative for abdominal pain, blood in stool, diarrhea, heartburn, nausea and vomiting.       Positive for rectal pain  Genitourinary: Negative for dysuria, frequency, hematuria and urgency.  Musculoskeletal: Positive for joint pain (right shoulder pain). Negative for back pain and myalgias.  Skin: Negative for itching and rash.  Neurological: Negative for dizziness, tingling and headaches.  Endo/Heme/Allergies: Positive for environmental allergies. Negative for polydipsia. Does not bruise/bleed easily.       Negative for hirsutism   Psychiatric/Behavioral: Negative for depression. The patient is not nervous/anxious and does not have insomnia.     Past Medical History:  Past Medical History:  Diagnosis Date  . Family history of breast cancer   . Menorrhagia with regular cycle     Past Surgical History:  Past Surgical History:  Procedure Laterality Date  . CESAREAN SECTION    . DILATION AND CURETTAGE OF UTERUS  04/08/2007  missed AB  . HYSTEROSCOPY W/ ENDOMETRIAL ABLATION  03/22/2017   with Novasure-Dr Tiburcio PeaHarris  . LASIK    . TUBAL LIGATION      Family History:  Family History  Problem Relation Age of Onset  . Breast cancer Mother 150  . Hypertension Mother   . Lymphoma Paternal Aunt   . Colon cancer Maternal Grandmother 90  . Breast cancer Paternal Grandmother     Social History:  Social History   Socioeconomic History  . Marital status:  Married    Spouse name: Not on file  . Number of children: 3  . Years of education: Not on file  . Highest education level: Not on file  Occupational History  . Occupation: Runner, broadcasting/film/videoTeacher  Social Needs  . Financial resource strain: Not on file  . Food insecurity    Worry: Not on file    Inability: Not on file  . Transportation needs    Medical: Not on file    Non-medical: Not on file  Tobacco Use  . Smoking status: Current Every Day Smoker    Packs/day: 0.50    Types: Cigarettes  . Smokeless tobacco: Never Used  Substance and Sexual Activity  . Alcohol use: No  . Drug use: No  . Sexual activity: Yes    Birth control/protection: Surgical    Comment: Ablation  Lifestyle  . Physical activity    Days per week: 0 days    Minutes per session: 0 min  . Stress: Only a little  Relationships  . Social Musicianconnections    Talks on phone: Not on file    Gets together: Not on file    Attends religious service: Not on file    Active member of club or organization: Not on file    Attends meetings of clubs or organizations: Not on file    Relationship status: Not on file  . Intimate partner violence    Fear of current or ex partner: Not on file    Emotionally abused: Not on file    Physically abused: Not on file    Forced sexual activity: Not on file  Other Topics Concern  . Not on file  Social History Narrative  . Not on file    Allergies:  No Known Allergies  Medications:  Current Outpatient Medications on File Prior to Visit  Medication Sig Dispense Refill  . lisdexamfetamine (VYVANSE) 10 MG capsule      No current facility-administered medications on file prior to visit.   Drinks a vitamin/amino acid/energy drink twice a day  Physical Exam Vitals: BP 90/64   Ht 5\' 2"  (1.575 m)   Wt 121 lb (54.9 kg)   BMI 22.13 kg/m   General: NAD HEENT: normocephalic, anicteric Neck: no thyroid enlargement, no palpable nodules, no cervical lymphadenopathy  Pulmonary: No increased work  of breathing, CTAB Cardiovascular: RRR, without murmur  Breast: Breast symmetrical, no tenderness, no palpable nodules or masses, no skin or nipple retraction present, no nipple discharge.  No axillary, infraclavicular or supraclavicular lymphadenopathy. Abdomen: Soft, non-tender, non-distended.  Umbilicus without lesions.  No hepatomegaly or masses palpable. No evidence of hernia. Genitourinary:  External: Normal external female genitalia.  Normal urethral meatus, normal  Bartholin's and Skene's glands.    Vagina: Normal vaginal mucosa, no evidence of prolapse.    Cervix: Grossly normal in appearance, no bleeding, non-tender  Uterus: Retroflexed, normal size, shape, and consistency, decreased mobility, and non-tender  Adnexa: No adnexal masses, non-tender  Rectal: deferred  Lymphatic:  no evidence of inguinal lymphadenopathy Extremities: no edema, erythema, or tenderness Neurologic: Grossly intact Psychiatric: mood appropriate, affect full  Hemoccult negative   Assessment: 38 y.o. [id number] well woman exam Tobacco use Right shoulder pain Rectal pain  Plan:   1) Breast cancer screening - recommend monthly self breast exam and annual mammograms starting at age 9.  Marland Kitchen 2) Cervical cancer screening - Pap was done.   3) Routine healthcare maintenance including cholesterol and diabetes screening-will do next year. Discussed calcium and vitamin D3 requirements.  4)Discussed smoking cessation  5) orthopedic referral for shoulder pain and GI referral for rectal pain.  6) RTO in 1 year and prn  Dalia Heading, CNM

## 2019-06-15 ENCOUNTER — Other Ambulatory Visit: Payer: Self-pay

## 2019-06-15 ENCOUNTER — Encounter: Payer: Self-pay | Admitting: Certified Nurse Midwife

## 2019-06-15 ENCOUNTER — Ambulatory Visit (INDEPENDENT_AMBULATORY_CARE_PROVIDER_SITE_OTHER): Payer: BC Managed Care – PPO | Admitting: Certified Nurse Midwife

## 2019-06-15 VITALS — BP 90/64 | Ht 62.0 in | Wt 121.0 lb

## 2019-06-15 DIAGNOSIS — K6289 Other specified diseases of anus and rectum: Secondary | ICD-10-CM | POA: Diagnosis not present

## 2019-06-15 DIAGNOSIS — Z01419 Encounter for gynecological examination (general) (routine) without abnormal findings: Secondary | ICD-10-CM

## 2019-06-15 DIAGNOSIS — M25511 Pain in right shoulder: Secondary | ICD-10-CM

## 2019-06-15 DIAGNOSIS — Z124 Encounter for screening for malignant neoplasm of cervix: Secondary | ICD-10-CM

## 2019-06-25 LAB — IGP, APTIMA HPV: HPV Aptima: NEGATIVE

## 2019-06-29 ENCOUNTER — Encounter: Payer: Self-pay | Admitting: Certified Nurse Midwife

## 2019-06-29 LAB — HEMOCCULT GUIAC POC 1CARD (OFFICE): Fecal Occult Blood, POC: NEGATIVE

## 2019-06-29 NOTE — Addendum Note (Signed)
Addended by: Dalia Heading on: 06/29/2019 03:09 PM   Modules accepted: Orders

## 2019-07-03 ENCOUNTER — Ambulatory Visit: Payer: Self-pay

## 2019-07-03 ENCOUNTER — Encounter: Payer: Self-pay | Admitting: Orthopaedic Surgery

## 2019-07-03 ENCOUNTER — Ambulatory Visit: Payer: BC Managed Care – PPO | Admitting: Orthopaedic Surgery

## 2019-07-03 VITALS — BP 109/63 | HR 98 | Ht 62.0 in | Wt 121.0 lb

## 2019-07-03 DIAGNOSIS — M25511 Pain in right shoulder: Secondary | ICD-10-CM

## 2019-07-03 DIAGNOSIS — G8929 Other chronic pain: Secondary | ICD-10-CM | POA: Diagnosis not present

## 2019-07-03 DIAGNOSIS — M7541 Impingement syndrome of right shoulder: Secondary | ICD-10-CM | POA: Diagnosis not present

## 2019-07-03 MED ORDER — BUPIVACAINE HCL 0.5 % IJ SOLN
2.0000 mL | INTRAMUSCULAR | Status: AC | PRN
  Administered 2019-07-03: 2 mL via INTRA_ARTICULAR

## 2019-07-03 MED ORDER — METHYLPREDNISOLONE ACETATE 40 MG/ML IJ SUSP
80.0000 mg | INTRAMUSCULAR | Status: AC | PRN
  Administered 2019-07-03: 80 mg via INTRA_ARTICULAR

## 2019-07-03 MED ORDER — LIDOCAINE HCL 2 % IJ SOLN
2.0000 mL | INTRAMUSCULAR | Status: AC | PRN
  Administered 2019-07-03: 2 mL

## 2019-07-03 NOTE — Progress Notes (Signed)
Office Visit Note   Patient: Brandi Valdez           Date of Birth: Jul 02, 1981           MRN: [id number] Visit Date: 07/03/2019              Requested by: Dalia Heading, [address],  [place] [address] PCP: [facility], [facility]   Assessment & Plan: Visit Diagnoses:  1. Chronic right shoulder pain   2. Impingement syndrome of right shoulder     Plan: Impingement syndrome right shoulder.  Will inject subacromial space with cortisone and monitor response.  Also provide shoulder exercises.  Consider MRI if no improvement  Follow-Up Instructions: Return if symptoms worsen or fail to improve.   Orders:  Orders Placed This Encounter  Procedures  . Large Joint Inj: R subacromial bursa  . XR Shoulder Right   No orders of the defined types were placed in this encounter.     Procedures: Large Joint Inj: R subacromial bursa on 07/03/2019 10:03 AM Indications: pain and diagnostic evaluation Details: 25 G 1.5 in needle, anterolateral approach  Arthrogram: No  Medications: 2 mL lidocaine 2 %; 2 mL bupivacaine 0.5 %; 80 mg methylPREDNISolone acetate 40 MG/ML Consent was given by the patient. Immediately prior to procedure a time out was called to verify the correct patient, procedure, equipment, support staff and site/side marked as required. Patient was prepped and draped in the usual sterile fashion.       Clinical Data: No additional findings.   Subjective: Chief Complaint  Patient presents with  . Right Shoulder - Pain  Patient presents today for right shoulder pain X 53months. No known injury. Her pain is located superiorly and travels down her arm. She said that there is dull pain constantly, but get become sharper with activity. She does not take anything for pain. She has numbness and tingling down her arm.  She is left hand dominant. Prior history of Olympic weightlifting without any problems.  No history of prior subluxation or shoulder  dislocation.  No related neck pain  HPI  Review of Systems   Objective: Vital Signs: BP 109/63   Pulse 98   Ht 5\' 2"  (1.575 m)   Wt 121 lb (54.9 kg)   BMI 22.13 kg/m   Physical Exam Constitutional:      Appearance: She is well-developed.  Eyes:     Pupils: Pupils are equal, round, and reactive to light.  Pulmonary:     Effort: Pulmonary effort is normal.  Skin:    General: Skin is warm and dry.  Neurological:     Mental Status: She is alert and oriented to person, place, and time.  Psychiatric:        Behavior: Behavior normal.     Ortho Exam awake alert and oriented x3.  Comfortable sitting.  No pain with range of motion of cervical spine.  Slightly positive impingement and extreme of external rotation.  Also has positive empty can testing.  Biceps intact and negative Speed sign.  Skin intact.  Mild tenderness along the anterior and lateral subacromial region with overhead motion.  Full overhead movement and abduction.  Good grip and release.  No grating or crepitation.  No apprehension with shoulder motion Specialty Comments:  No specialty comments available.  Imaging: Xr Shoulder Right  Result Date: 07/03/2019 Films of the right shoulder obtained in 4 projections.  There are no acute changes.  No degenerative changes at the  chromic clavicular joint and no ectopic calcification.  Normal space between the humeral head and the acromium but there is slight superior migration of the humeral head in relationship to the glenoid.    PMFS History: Patient Active Problem List   Diagnosis Date Noted  . Impingement syndrome of right shoulder 07/03/2019  . Tobacco dependence 03/21/2017   Past Medical History:  Diagnosis Date  . ADD (attention deficit disorder)   . Family history of breast cancer   . Menorrhagia with regular cycle     Family History  Problem Relation Age of Onset  . Breast cancer Mother 4450  . Hypertension Mother   . Lymphoma Paternal Aunt   . Colon  cancer Maternal Grandmother 90  . Breast cancer Paternal Grandmother     Past Surgical History:  Procedure Laterality Date  . CESAREAN SECTION    . DILATION AND CURETTAGE OF UTERUS  04/08/2007   missed AB  . HYSTEROSCOPY W/ ENDOMETRIAL ABLATION  03/22/2017   with Novasure-Dr Tiburcio PeaHarris  . LASIK    . TUBAL LIGATION     Social History   Occupational History  . Occupation: Runner, broadcasting/film/videoTeacher  Tobacco Use  . Smoking status: Current Every Day Smoker    Packs/day: 0.50    Types: Cigarettes  . Smokeless tobacco: Never Used  Substance and Sexual Activity  . Alcohol use: No  . Drug use: No  . Sexual activity: Yes    Birth control/protection: Surgical    Comment: Ablation

## 2019-07-03 NOTE — Patient Instructions (Signed)
Shoulder Exercises Ask your health care provider which exercises are safe for you. Do exercises exactly as told by your health care provider and adjust them as directed. It is normal to feel mild stretching, pulling, tightness, or discomfort as you do these exercises. Stop right away if you feel sudden pain or your pain gets worse. Do not begin these exercises until told by your health care provider. Stretching exercises External rotation and abduction This exercise is sometimes called corner stretch. This exercise rotates your arm outward (external rotation) and moves your arm out from your body (abduction). 1. Stand in a doorway with one of your feet slightly in front of the other. This is called a staggered stance. If you cannot reach your forearms to the door frame, stand facing a corner of a room. 2. Choose one of the following positions as told by your health care provider: ? Place your hands and forearms on the door frame above your head. ? Place your hands and forearms on the door frame at the height of your head. ? Place your hands on the door frame at the height of your elbows. 3. Slowly move your weight onto your front foot until you feel a stretch across your chest and in the front of your shoulders. Keep your head and chest upright and keep your abdominal muscles tight. 4. Hold for __________ seconds. 5. To release the stretch, shift your weight to your back foot. Repeat __________ times. Complete this exercise __________ times a day. Extension, standing 1. Stand and hold a broomstick, a cane, or a similar object behind your back. ? Your hands should be a little wider than shoulder width apart. ? Your palms should face away from your back. 2. Keeping your elbows straight and your shoulder muscles relaxed, move the stick away from your body until you feel a stretch in your shoulders (extension). ? Avoid shrugging your shoulders while you move the stick. Keep your shoulder blades tucked  down toward the middle of your back. 3. Hold for __________ seconds. 4. Slowly return to the starting position. Repeat __________ times. Complete this exercise __________ times a day. Range-of-motion exercises Pendulum  1. Stand near a wall or a surface that you can hold onto for balance. 2. Bend at the waist and let your left / right arm hang straight down. Use your other arm to support you. Keep your back straight and do not lock your knees. 3. Relax your left / right arm and shoulder muscles, and move your hips and your trunk so your left / right arm swings freely. Your arm should swing because of the motion of your body, not because you are using your arm or shoulder muscles. 4. Keep moving your hips and trunk so your arm swings in the following directions, as told by your health care provider: ? Side to side. ? Forward and backward. ? In clockwise and counterclockwise circles. 5. Continue each motion for __________ seconds, or for as long as told by your health care provider. 6. Slowly return to the starting position. Repeat __________ times. Complete this exercise __________ times a day. Shoulder flexion, standing  1. Stand and hold a broomstick, a cane, or a similar object. Place your hands a little more than shoulder width apart on the object. Your left / right hand should be palm up, and your other hand should be palm down. 2. Keep your elbow straight and your shoulder muscles relaxed. Push the stick up with your healthy arm to   raise your left / right arm in front of your body, and then over your head until you feel a stretch in your shoulder (flexion). ? Avoid shrugging your shoulder while you raise your arm. Keep your shoulder blade tucked down toward the middle of your back. 3. Hold for __________ seconds. 4. Slowly return to the starting position. Repeat __________ times. Complete this exercise __________ times a day. Shoulder abduction, standing 1. Stand and hold a broomstick,  a cane, or a similar object. Place your hands a little more than shoulder width apart on the object. Your left / right hand should be palm up, and your other hand should be palm down. 2. Keep your elbow straight and your shoulder muscles relaxed. Push the object across your body toward your left / right side. Raise your left / right arm to the side of your body (abduction) until you feel a stretch in your shoulder. ? Do not raise your arm above shoulder height unless your health care provider tells you to do that. ? If directed, raise your arm over your head. ? Avoid shrugging your shoulder while you raise your arm. Keep your shoulder blade tucked down toward the middle of your back. 3. Hold for __________ seconds. 4. Slowly return to the starting position. Repeat __________ times. Complete this exercise __________ times a day. Internal rotation  1. Place your left / right hand behind your back, palm up. 2. Use your other hand to dangle an exercise band, a towel, or a similar object over your shoulder. Grasp the band with your left / right hand so you are holding on to both ends. 3. Gently pull up on the band until you feel a stretch in the front of your left / right shoulder. The movement of your arm toward the center of your body is called internal rotation. ? Avoid shrugging your shoulder while you raise your arm. Keep your shoulder blade tucked down toward the middle of your back. 4. Hold for __________ seconds. 5. Release the stretch by letting go of the band and lowering your hands. Repeat __________ times. Complete this exercise __________ times a day. Strengthening exercises External rotation  1. Sit in a stable chair without armrests. 2. Secure an exercise band to a stable object at elbow height on your left / right side. 3. Place a soft object, such as a folded towel or a small pillow, between your left / right upper arm and your body to move your elbow about 4 inches (10 cm) away  from your side. 4. Hold the end of the exercise band so it is tight and there is no slack. 5. Keeping your elbow pressed against the soft object, slowly move your forearm out, away from your abdomen (external rotation). Keep your body steady so only your forearm moves. 6. Hold for __________ seconds. 7. Slowly return to the starting position. Repeat __________ times. Complete this exercise __________ times a day. Shoulder abduction  1. Sit in a stable chair without armrests, or stand up. 2. Hold a __________ weight in your left / right hand, or hold an exercise band with both hands. 3. Start with your arms straight down and your left / right palm facing in, toward your body. 4. Slowly lift your left / right hand out to your side (abduction). Do not lift your hand above shoulder height unless your health care provider tells you that this is safe. ? Keep your arms straight. ? Avoid shrugging your shoulder while you   do this movement. Keep your shoulder blade tucked down toward the middle of your back. 5. Hold for __________ seconds. 6. Slowly lower your arm, and return to the starting position. Repeat __________ times. Complete this exercise __________ times a day. Shoulder extension 1. Sit in a stable chair without armrests, or stand up. 2. Secure an exercise band to a stable object in front of you so it is at shoulder height. 3. Hold one end of the exercise band in each hand. Your palms should face each other. 4. Straighten your elbows and lift your hands up to shoulder height. 5. Step back, away from the secured end of the exercise band, until the band is tight and there is no slack. 6. Squeeze your shoulder blades together as you pull your hands down to the sides of your thighs (extension). Stop when your hands are straight down by your sides. Do not let your hands go behind your body. 7. Hold for __________ seconds. 8. Slowly return to the starting position. Repeat __________ times.  Complete this exercise __________ times a day. Shoulder row 1. Sit in a stable chair without armrests, or stand up. 2. Secure an exercise band to a stable object in front of you so it is at waist height. 3. Hold one end of the exercise band in each hand. Position your palms so that your thumbs are facing the ceiling (neutral position). 4. Bend each of your elbows to a 90-degree angle (right angle) and keep your upper arms at your sides. 5. Step back until the band is tight and there is no slack. 6. Slowly pull your elbows back behind you. 7. Hold for __________ seconds. 8. Slowly return to the starting position. Repeat __________ times. Complete this exercise __________ times a day. Shoulder press-ups  1. Sit in a stable chair that has armrests. Sit upright, with your feet flat on the floor. 2. Put your hands on the armrests so your elbows are bent and your fingers are pointing forward. Your hands should be about even with the sides of your body. 3. Push down on the armrests and use your arms to lift yourself off the chair. Straighten your elbows and lift yourself up as much as you comfortably can. ? Move your shoulder blades down, and avoid letting your shoulders move up toward your ears. ? Keep your feet on the ground. As you get stronger, your feet should support less of your body weight as you lift yourself up. 4. Hold for __________ seconds. 5. Slowly lower yourself back into the chair. Repeat __________ times. Complete this exercise __________ times a day. Wall push-ups  1. Stand so you are facing a stable wall. Your feet should be about one arm-length away from the wall. 2. Lean forward and place your palms on the wall at shoulder height. 3. Keep your feet flat on the floor as you bend your elbows and lean forward toward the wall. 4. Hold for __________ seconds. 5. Straighten your elbows to push yourself back to the starting position. Repeat __________ times. Complete this exercise  __________ times a day. This information is not intended to replace advice given to you by your health care provider. Make sure you discuss any questions you have with your health care provider. Document Released: 10/03/2005 Document Revised: 03/13/2019 Document Reviewed: 12/19/2018 Elsevier Patient Education  2020 [employer]  

## 2019-07-13 ENCOUNTER — Encounter: Payer: Self-pay | Admitting: *Deleted

## 2020-01-15 ENCOUNTER — Encounter (INDEPENDENT_AMBULATORY_CARE_PROVIDER_SITE_OTHER): Payer: Self-pay

## 2020-06-15 ENCOUNTER — Encounter: Payer: Self-pay | Admitting: Certified Nurse Midwife

## 2020-06-15 ENCOUNTER — Other Ambulatory Visit: Payer: Self-pay

## 2020-06-15 ENCOUNTER — Ambulatory Visit (INDEPENDENT_AMBULATORY_CARE_PROVIDER_SITE_OTHER): Payer: BC Managed Care – PPO | Admitting: Certified Nurse Midwife

## 2020-06-15 VITALS — BP 112/70 | HR 88 | Resp 16 | Ht 62.0 in | Wt 120.2 lb

## 2020-06-15 DIAGNOSIS — F17200 Nicotine dependence, unspecified, uncomplicated: Secondary | ICD-10-CM | POA: Diagnosis not present

## 2020-06-15 DIAGNOSIS — Z1231 Encounter for screening mammogram for malignant neoplasm of breast: Secondary | ICD-10-CM

## 2020-06-15 DIAGNOSIS — Z01419 Encounter for gynecological examination (general) (routine) without abnormal findings: Secondary | ICD-10-CM

## 2020-06-15 DIAGNOSIS — Z803 Family history of malignant neoplasm of breast: Secondary | ICD-10-CM

## 2020-06-15 NOTE — Progress Notes (Addendum)
Gynecology Annual Exam  PCP: [facility]  Chief Complaint:  Chief Complaint  Patient presents with  . Gynecologic Exam    annual exam    History of Present Illness:Brandi Valdez presents today for her annual exam. She is a 39 year old Caucasian/White female, G5 P3023, whose LMP was 03/22/17. She has been amenorrheic since her Novasure ablation 03/22/2017 for menorrhagia. She has had no spotting. She has had some cyclical lower back pain in the last few months that lasts a few days. She takes 2 Advil with relief of pain. She reports this pain is similar to the pain she would have with her menses prior to her ablation.  The patient's past medical history is notable for a history of three C-sections and a history of eczema.  Since her last annual GYN exam dated 06/15/2019, she reports that she saw the orthopedic doctor for the right shoulder pain she was having and it resolved after getting a steroid injection in the joint. She also had concerns regarding a rectal pain she was experiencing, but that resolved prior to her GI appointment She is sexually active. She is currently using permanent sterilization for contraception.  Her most recent pap smear was obtained 06/15/2019 and was NIL/ negative HRHPV. Has not had a mammogram yet, but is eligible.  There is a positive history of breast cancer in her mother and at age 43 and her paternal aunt at age 22.Marland Kitchen Genetic testing has not been done.  There is no family history of ovarian cancer.  The patient does do occasional self breast exams.  The patient smokes a little over 1/2 ppd. She did stop smoking with her last pregnancy then began smoking 2 years after her last pregnancy. The patient does not drink alcohol.  The patient does not use illegal drugs.  The patient does not exercise other than playing with her puppies The patient may not get adequate calcium in her diet due to lactose intolerance. Has started taking a  calcium and vitamin D3 supplement She had a recent cholesterol screen in 2021  that she reports was normal.     Review of Systems: Review of Systems  Constitutional: Negative for chills, fever and weight loss.  HENT: Negative for congestion, sinus pain and sore throat.   Eyes: Negative for blurred vision and pain.  Respiratory: Negative for hemoptysis, shortness of breath and wheezing.   Cardiovascular: Negative for chest pain, palpitations and leg swelling.  Gastrointestinal: Negative for abdominal pain, blood in stool, diarrhea, heartburn, nausea and vomiting.  Genitourinary: Negative for dysuria, frequency, hematuria and urgency.       Positive for amenorrhea  Musculoskeletal: Positive for back pain (cyclical x 2-3 months). Negative for joint pain and myalgias.  Skin: Negative for itching and rash.  Neurological: Negative for dizziness, tingling and headaches.  Endo/Heme/Allergies: Positive for environmental allergies. Negative for polydipsia. Does not bruise/bleed easily.       Negative for hirsutism   Psychiatric/Behavioral: Negative for depression. The patient is not nervous/anxious and does not have insomnia.     Past Medical History:  Past Medical History:  Diagnosis Date  . ADD (attention deficit disorder)   . Family history of breast cancer   . Menorrhagia with regular cycle     Past Surgical History:  Past Surgical History:  Procedure Laterality Date  . CESAREAN SECTION    . DILATION AND CURETTAGE OF UTERUS  04/08/2007   missed AB  . HYSTEROSCOPY W/  ENDOMETRIAL ABLATION  03/22/2017   with Novasure-Dr Tiburcio Pea  . LASIK    . TUBAL LIGATION      Family History:  Family History  Problem Relation Age of Onset  . Breast cancer Mother 18  . Hypertension Mother   . Lymphoma Paternal Aunt   . Colon cancer Maternal Grandmother 90  . Breast cancer Paternal Grandmother 75    Social History:  Social History   Socioeconomic History  . Marital status: Married     Spouse name: Not on file  . Number of children: 3  . Years of education: Not on file  . Highest education level: Not on file  Occupational History  . Occupation: Runner, broadcasting/film/video  Tobacco Use  . Smoking status: Current Every Day Smoker    Packs/day: 0.50    Types: Cigarettes  . Smokeless tobacco: Never Used  Vaping Use  . Vaping Use: Never used  Substance and Sexual Activity  . Alcohol use: No  . Drug use: No  . Sexual activity: Yes    Birth control/protection: Surgical    Comment: Ablation  Other Topics Concern  . Not on file  Social History Narrative  . Not on file   Social Determinants of Health   Financial Resource Strain:   . Difficulty of Paying Living Expenses:   Food Insecurity:   . Worried About Programme researcher, broadcasting/film/video in the Last Year:   . Barista in the Last Year:   Transportation Needs:   . Freight forwarder (Medical):   Marland Kitchen Lack of Transportation (Non-Medical):   Physical Activity:   . Days of Exercise per Week:   . Minutes of Exercise per Session:   Stress:   . Feeling of Stress :   Social Connections:   . Frequency of Communication with Friends and Family:   . Frequency of Social Gatherings with Friends and Family:   . Attends Religious Services:   . Active Member of Clubs or Organizations:   . Attends Banker Meetings:   Marland Kitchen Marital Status:   Intimate Partner Violence:   . Fear of Current or Ex-Partner:   . Emotionally Abused:   Marland Kitchen Physically Abused:   . Sexually Abused:     Allergies:  No Known Allergies  Medications:  Current Outpatient Medications on File Prior to Visit  Medication Sig Dispense Refill  . lisdexamfetamine (VYVANSE) 10 MG capsule      No current facility-administered medications on file prior to visit.  Calcium 600 mgm with vitamin D3  Physical Exam Vitals: BP 112/70   Pulse 88   Resp 16   Ht 5\' 2"  (1.575 m)   Wt 120 lb 3.2 oz (54.5 kg)   SpO2 98%   BMI 21.98 kg/m   General: petite WF in NAD HEENT:  normocephalic, anicteric Neck: no thyroid enlargement, no palpable nodules, no cervical lymphadenopathy  Pulmonary: No increased work of breathing, CTAB Cardiovascular: RRR, without murmur  Breast: Breast symmetrical, no tenderness, no palpable nodules or masses, no skin or nipple retraction present, no nipple discharge.  No axillary, infraclavicular or supraclavicular lymphadenopathy. Abdomen: Soft, non-tender, non-distended.  Umbilicus without lesions.  No hepatomegaly or masses palpable. No evidence of hernia. Genitourinary:  External: Normal external female genitalia.  Normal urethral meatus, normal  Bartholin's and Skene's glands.    Vagina: Normal vaginal mucosa, no evidence of prolapse.    Cervix: Grossly normal in appearance, no bleeding, non-tender  Uterus: Retroflexed, normal size, shape, and consistency, decreased mobility,  and non-tender  Adnexa: No adnexal masses, non-tender  Rectal: deferred  Lymphatic: no evidence of inguinal lymphadenopathy Extremities: no edema, erythema, or tenderness Neurologic: Grossly intact Psychiatric: mood appropriate, affect normal   Assessment: 39 y.o. [id number] well woman exam Tobacco use Family history of breast cancer  Plan:   1) Breast cancer screening - recommend monthly self breast exam and annual mammograms starting now.   Marland Kitchen 2) Cervical cancer screening - Pap was not done. She desires Pap smears every 3 years  3) Discussed calcium and vitamin D3 requirements.  4)Discussed smoking cessation- She is interested in stopping and  was asking about  trying Wellbutrin, but this is not recommended while taking Vyvanse due to both medications lowering seizure threshold. Given [facility] handout on quitting which includes the 1-800-quit now number  5) RTO in 1 year and prn  Farrel Conners, CNM  Addendum: According to [facility] lifetime risk of breast cancer is 20.3%. She does qualify for MRIs of the breast. Farrel Conners, CNM

## 2020-06-24 ENCOUNTER — Ambulatory Visit
Admission: RE | Admit: 2020-06-24 | Discharge: 2020-06-24 | Disposition: A | Discharge status: Home / Self Care | Payer: BC Managed Care – PPO | Source: Ambulatory Visit | Attending: Certified Nurse Midwife | Admitting: Certified Nurse Midwife

## 2020-06-24 DIAGNOSIS — Z1231 Encounter for screening mammogram for malignant neoplasm of breast: Secondary | ICD-10-CM | POA: Diagnosis not present

## 2020-11-09 ENCOUNTER — Telehealth: Payer: Self-pay

## 2020-11-09 NOTE — Telephone Encounter (Signed)
Patient aware Starleen Blue, CNM has retired and Dr. Tiburcio Pea is out of the office this week. She is ok to wait til Monday.

## 2020-11-09 NOTE — Telephone Encounter (Signed)
Patient requesting referral to [facility]. She is wanting to switch to them from [facility] so she can have a Dr closer by for her ADHD. ZH#[phone]

## 2020-11-14 ENCOUNTER — Other Ambulatory Visit: Payer: Self-pay | Admitting: Obstetrics & Gynecology

## 2020-11-14 DIAGNOSIS — F988 Other specified behavioral and emotional disorders with onset usually occurring in childhood and adolescence: Secondary | ICD-10-CM

## 2020-11-14 NOTE — Telephone Encounter (Signed)
Patient aware.

## 2021-02-06 IMAGING — MG DIGITAL SCREENING BILAT W/ TOMO W/ CAD
8 series · 9 of 24 positions shown · non-contrast
Comparison: None.

CLINICAL DATA: Screening.

EXAM:
DIGITAL SCREENING BILATERAL MAMMOGRAM WITH TOMO AND CAD

[L CC synth-2D]
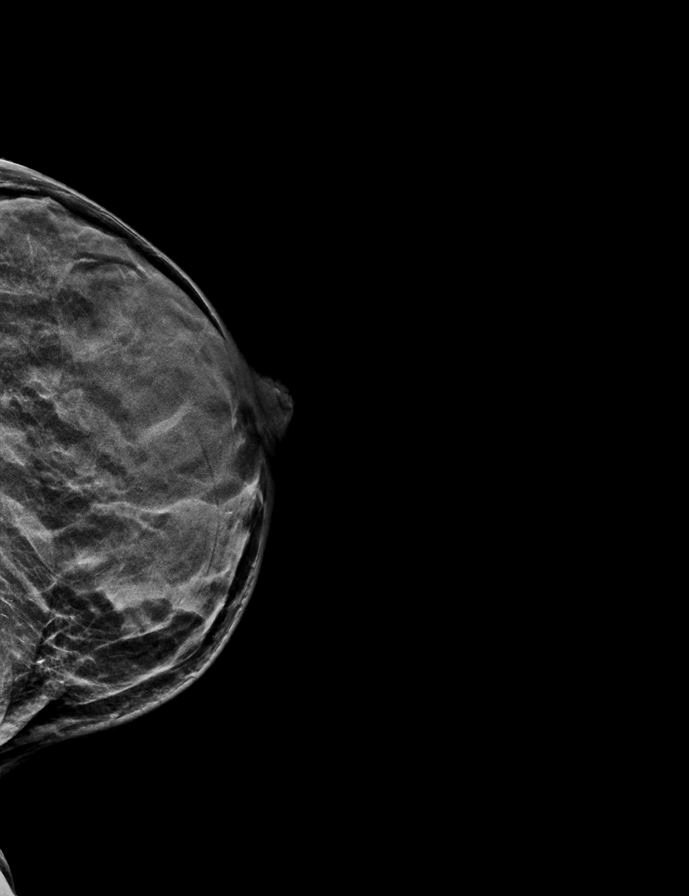

[R CC synth-2D]
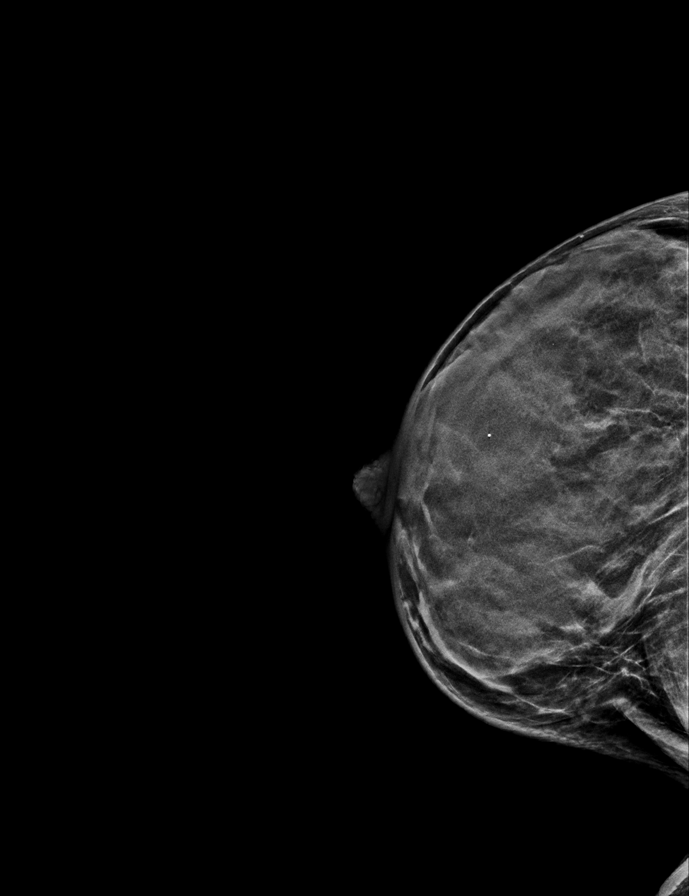

[L MLO synth-2D]
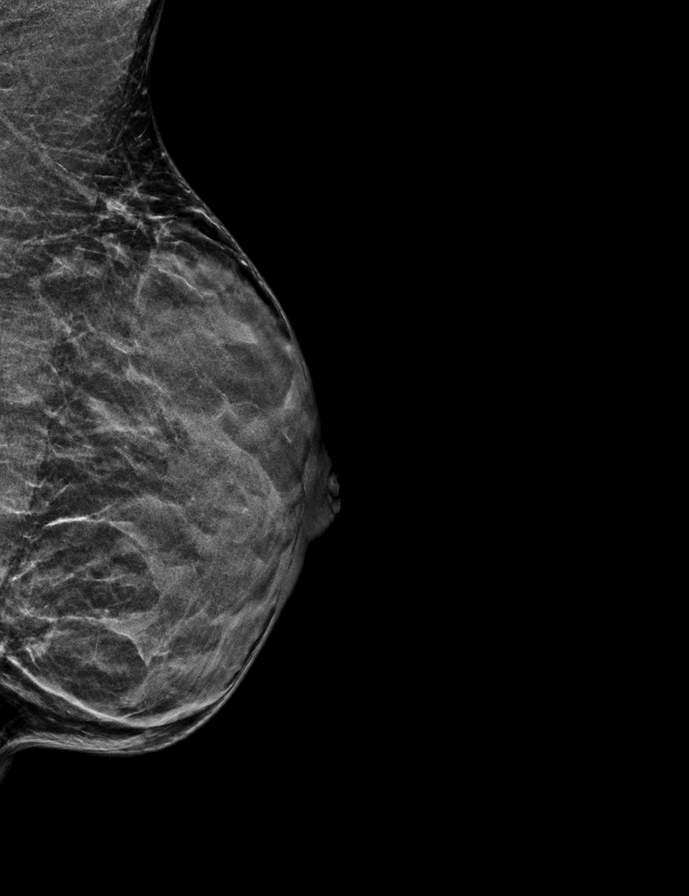

[R MLO synth-2D]
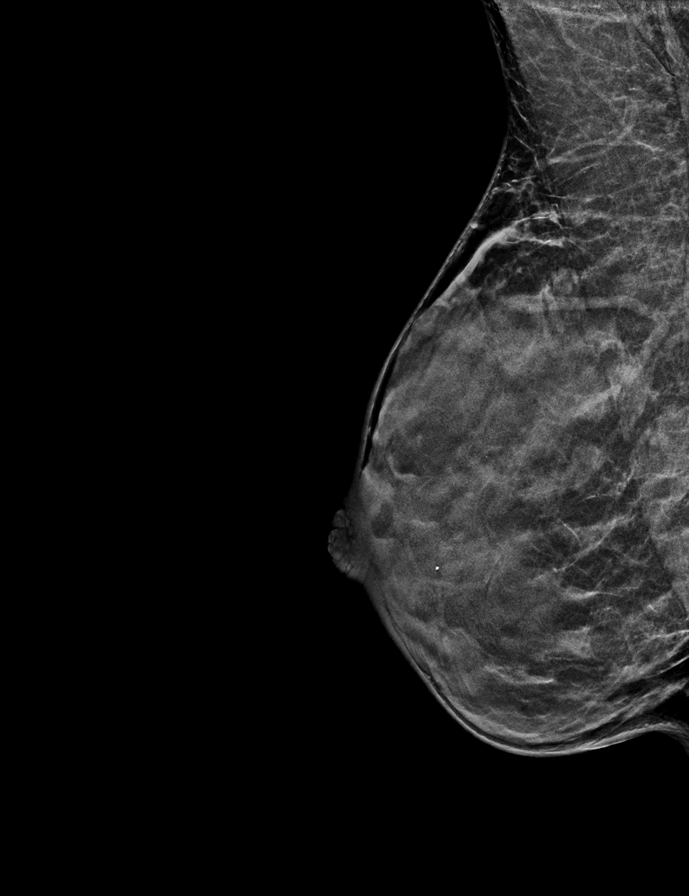

[L MLO tomo · 2 of 31 frames shown]
[frame 11/31]
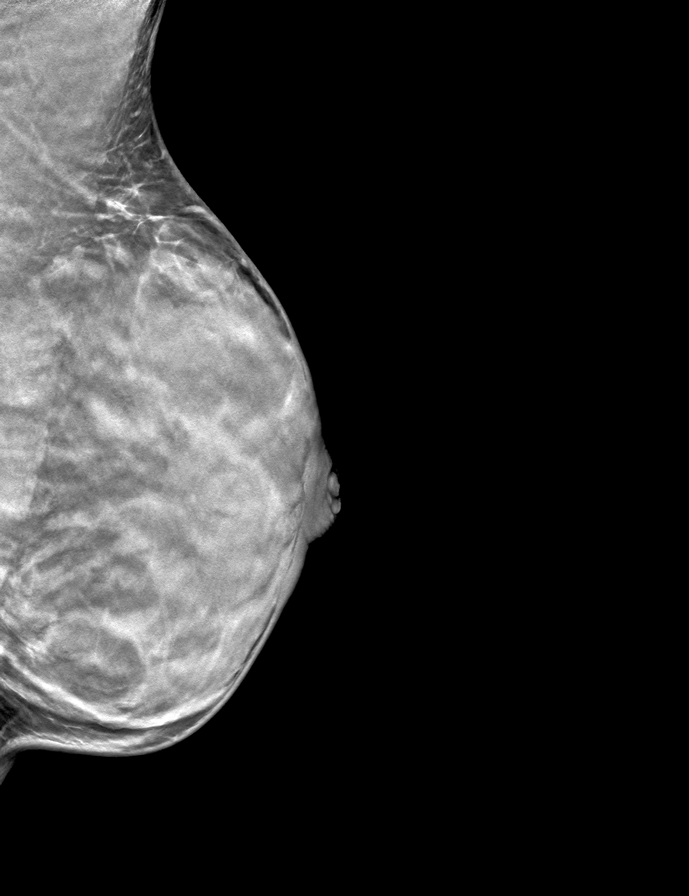
[frame 16/31]
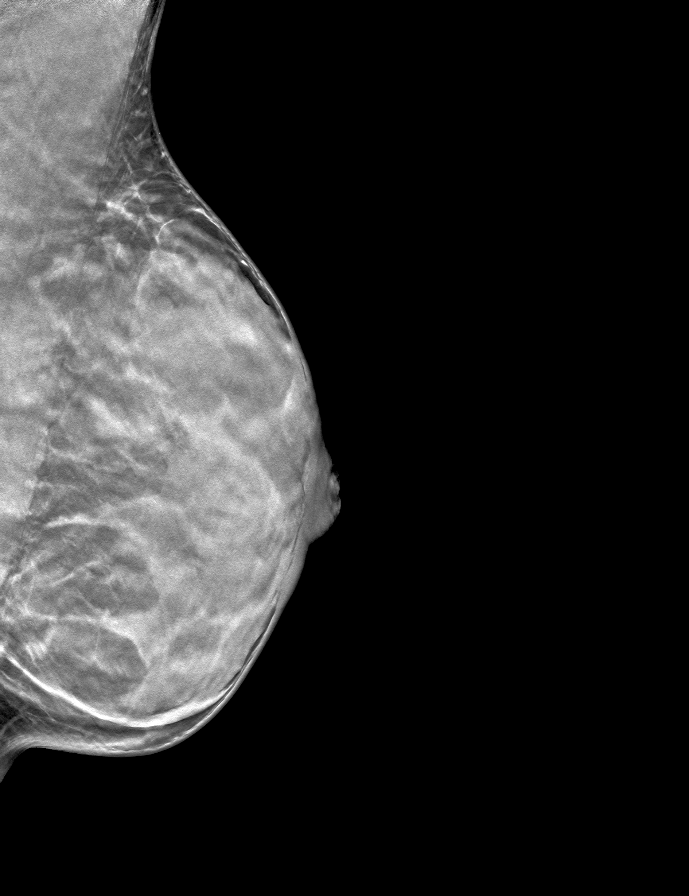

[L CC tomo · tomo slice 17/34.0]
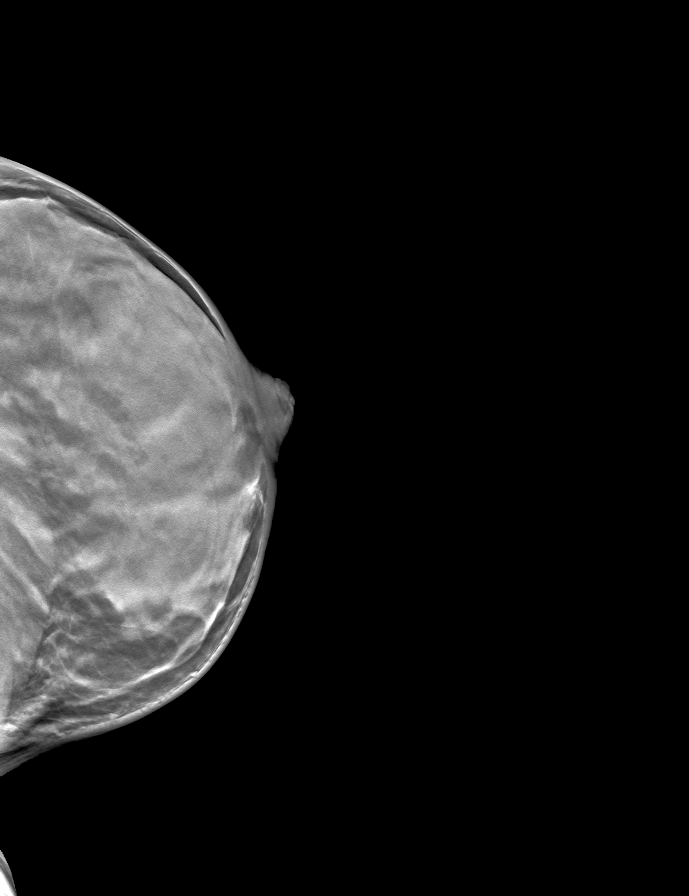

[R CC tomo · tomo slice 17/34.0]
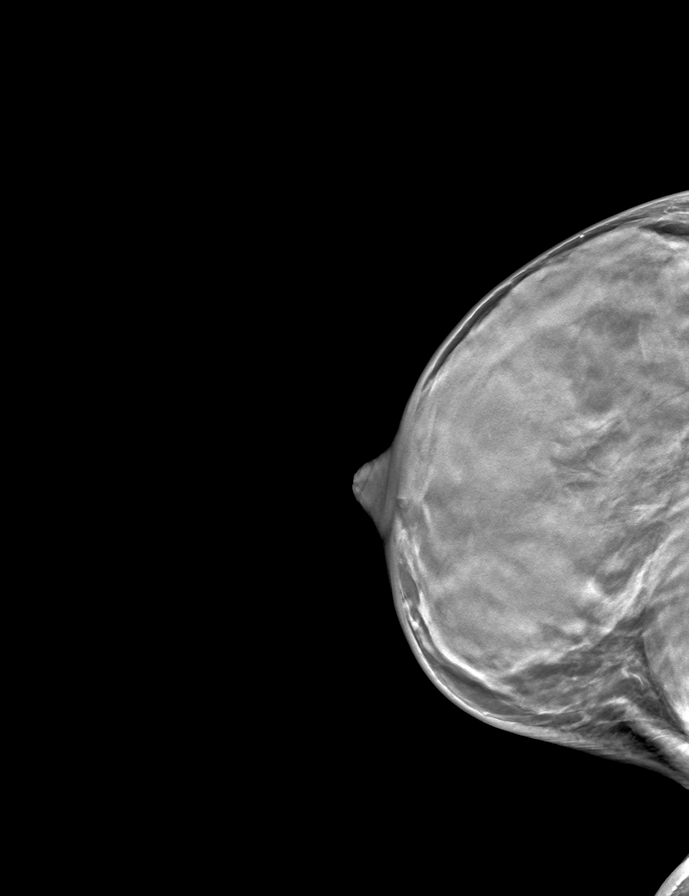

[R MLO tomo · tomo slice 17/34.0]
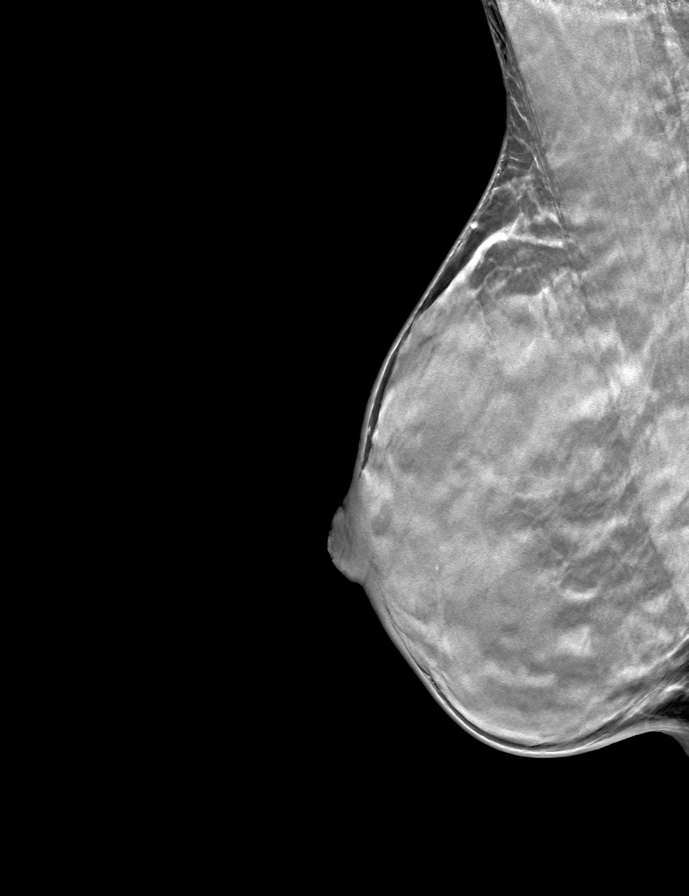

[9 of 24 positions shown; findings below may reference images not displayed]

ACR Breast Density Category d: The breast tissue is extremely dense,
which lowers the sensitivity of mammography.
FINDINGS: There are no findings suspicious for malignancy. Images were
processed with CAD.
IMPRESSION: No mammographic evidence of malignancy. A result letter of this
screening mammogram will be mailed directly to the patient.

RECOMMENDATION:
Screening mammogram in one year. (Code:F8-6-TBV)

BI-RADS CATEGORY  1: Negative.

## 2021-06-21 ENCOUNTER — Ambulatory Visit (INDEPENDENT_AMBULATORY_CARE_PROVIDER_SITE_OTHER): Payer: BC Managed Care – PPO | Admitting: Advanced Practice Midwife

## 2021-06-21 ENCOUNTER — Encounter: Payer: Self-pay | Admitting: Advanced Practice Midwife

## 2021-06-21 VITALS — BP 100/60 | Ht 62.0 in | Wt 122.0 lb

## 2021-06-21 DIAGNOSIS — Z0000 Encounter for general adult medical examination without abnormal findings: Secondary | ICD-10-CM | POA: Diagnosis not present

## 2021-06-21 NOTE — Progress Notes (Signed)
Gynecology Annual Exam  PCP: [facility], [facility]  Chief Complaint:  Chief Complaint  Patient presents with   Annual Exam    History of Present Illness: Patient is a 40 y.o. [id number] presents for annual exam. The patient has no complaints today.   LMP: No LMP recorded. Patient has had an ablation. She experiences cyclical low back pain Postcoital Bleeding: no   The patient is sexually active. She currently uses tubal ligation for contraception. She denies dyspareunia.  The patient  does occasionally  perform self breast exams.  There is no notable family history of breast or ovarian cancer in her family.  The patient wears seatbelts: yes.   The patient has regular exercise: she admits an active lifestyle, generally healthy diet and adequate sleep. Her primary beverages are sweet tea and cola.    The patient denies current symptoms of depression. Her current medication manage her anxiety.  Review of Systems: Review of Systems  Constitutional:  Negative for chills and fever.  HENT:  Negative for congestion, ear discharge, ear pain, hearing loss, sinus pain and sore throat.   Eyes:  Negative for blurred vision and double vision.  Respiratory:  Negative for cough, shortness of breath and wheezing.   Cardiovascular:  Negative for chest pain, palpitations and leg swelling.  Gastrointestinal:  Negative for abdominal pain, blood in stool, constipation, diarrhea, heartburn, melena, nausea and vomiting.  Genitourinary:  Negative for dysuria, flank pain, frequency, hematuria and urgency.  Musculoskeletal:  Negative for back pain, joint pain and myalgias.  Skin:  Negative for itching and rash.  Neurological:  Negative for dizziness, tingling, tremors, sensory change, speech change, focal weakness, seizures, loss of consciousness, weakness and headaches.       Positive for occasional numbness/tingling in left shoulder during sleep  Endo/Heme/Allergies:  Negative for environmental  allergies. Does not bruise/bleed easily.  Psychiatric/Behavioral:  Negative for depression, hallucinations, memory loss, substance abuse and suicidal ideas. The patient is not nervous/anxious and does not have insomnia.    Past Medical History:  Patient Active Problem List   Diagnosis Date Noted   Tobacco dependence 03/21/2017    Past Surgical History:  Past Surgical History:  Procedure Laterality Date   CESAREAN SECTION     DILATION AND CURETTAGE OF UTERUS  04/08/2007   missed AB   HYSTEROSCOPY W/ ENDOMETRIAL ABLATION  03/22/2017   with [facility]-Dr Tiburcio Pea   LASIK     TUBAL LIGATION      Gynecologic History:  No LMP recorded. Patient has had an ablation.  Last Pap: 2020 Results were:  no abnormalities  Last mammogram: 2021 Results were: BI-RAD I  Obstetric History: [id number]  Family History:  Family History  Problem Relation Age of Onset   Breast cancer Mother 34   Hypertension Mother    Lymphoma Paternal Aunt    Colon cancer Maternal Grandmother 27   Breast cancer Paternal Grandmother 41    Social History:  Social History   Socioeconomic History   Marital status: Married    Spouse name: Not on file   Number of children: 3   Years of education: Not on file   Highest education level: Not on file  Occupational History   Occupation: Teacher  Tobacco Use   Smoking status: Every Day    Packs/day: 0.50    Types: Cigarettes   Smokeless tobacco: Never  Vaping Use   Vaping Use: Never used  Substance and Sexual Activity   Alcohol use: No   Drug  use: No   Sexual activity: Yes    Birth control/protection: Surgical    Comment: Ablation  Other Topics Concern   Not on file  Social History Narrative   Not on file   Social Determinants of Health   Financial Resource Strain: Not on file  Food Insecurity: Not on file  Transportation Needs: Not on file  Physical Activity: Not on file  Stress: Not on file  Social Connections: Not on file  Intimate Partner  Violence: Not on file    Allergies:  No Known Allergies  Medications: Prior to Admission medications   Medication Sig Start Date End Date Taking? Authorizing Provider  lisdexamfetamine (VYVANSE) 10 MG capsule    Yes [provider]  sertraline (ZOLOFT) 50 MG tablet  02/18/21  Yes [provider]    Physical Exam Vitals: Blood pressure 100/60, height 5\' 2"  (1.575 m), weight 122 lb (55.3 kg).  General: NAD HEENT: normocephalic, anicteric Thyroid: no enlargement, no palpable nodules Pulmonary: No increased work of breathing, CTAB Cardiovascular: RRR, distal pulses 2+ Breast: Breast symmetrical, no tenderness, no palpable nodules or masses, no skin or nipple retraction present, no nipple discharge.  No axillary or supraclavicular lymphadenopathy. Abdomen: NABS, soft, non-tender, non-distended.  Umbilicus without lesions.  No hepatomegaly, splenomegaly or masses palpable. No evidence of hernia  Genitourinary: deferred for no concerns/PAP interval Extremities: no edema, erythema, or tenderness Neurologic: Grossly intact Psychiatric: mood appropriate, affect full   Assessment: 40 y.o. 45 routine annual exam  Plan: Problem List Items Addressed This Visit   None Visit Diagnoses     Well woman exam without gynecological exam    -  Primary       1) Mammogram - recommend every 1 or 2 year screening mammogram.  Mammogram:  patient prefers every 2 year screening schedule   2) STI screening  was offered and declined  3) ASCCP guidelines and rationale discussed.  Patient opts for every 3 years screening interval  4) Contraception - the patient is currently using  tubal ligation.    5) Colonoscopy -- Screening recommended starting at age 69 for average risk individuals, age 72 for individuals deemed at increased risk (including African Americans) and recommended to continue until age 23.  For patient age 52-85 individualized approach is recommended.  Gold  standard screening is via colonoscopy, Cologuard screening is an acceptable alternative for patient unwilling or unable to undergo colonoscopy.  "Colorectal cancer screening for average?risk adults: 2018 guideline update from the American Cancer Society"CA: A Cancer Journal for Clinicians: May 01, 2017   6) Routine healthcare maintenance including cholesterol, diabetes screening discussed Declines  7) Return in about 1 year (around 06/21/2022) for annual established gyn.   06/23/2022, CNM [facility] 06/21/2021, 9:32 AM

## 2021-09-17 ENCOUNTER — Other Ambulatory Visit: Payer: Self-pay

## 2021-09-17 ENCOUNTER — Ambulatory Visit
Admission: RE | Admit: 2021-09-17 | Discharge: 2021-09-17 | Disposition: A | Discharge status: Home / Self Care | Payer: BC Managed Care – PPO | Source: Ambulatory Visit

## 2021-09-17 ENCOUNTER — Encounter: Payer: Self-pay | Admitting: Emergency Medicine

## 2021-09-17 ENCOUNTER — Emergency Department
Admission: EM | Admit: 2021-09-17 | Discharge: 2021-09-17 | Disposition: A | Discharge status: Home / Self Care | Payer: BC Managed Care – PPO | Attending: Emergency Medicine | Admitting: Emergency Medicine

## 2021-09-17 DIAGNOSIS — S0502XA Injury of conjunctiva and corneal abrasion without foreign body, left eye, initial encounter: Secondary | ICD-10-CM | POA: Insufficient documentation

## 2021-09-17 DIAGNOSIS — W541XXA Struck by dog, initial encounter: Secondary | ICD-10-CM | POA: Diagnosis not present

## 2021-09-17 DIAGNOSIS — F17210 Nicotine dependence, cigarettes, uncomplicated: Secondary | ICD-10-CM | POA: Diagnosis not present

## 2021-09-17 DIAGNOSIS — Z79899 Other long term (current) drug therapy: Secondary | ICD-10-CM | POA: Diagnosis not present

## 2021-09-17 DIAGNOSIS — H5712 Ocular pain, left eye: Secondary | ICD-10-CM | POA: Diagnosis present

## 2021-09-17 MED ORDER — POLYMYXIN B-TRIMETHOPRIM 10000-0.1 UNIT/ML-% OP SOLN
2.0000 [drp] | Freq: Four times a day (QID) | OPHTHALMIC | 0 refills | Status: DC
Start: 2021-09-17 — End: 2022-06-29

## 2021-09-17 MED ORDER — TETRACAINE HCL 0.5 % OP SOLN
2.0000 [drp] | Freq: Once | OPHTHALMIC | Status: AC
  Administered 2021-09-17: 2 [drp] via OPHTHALMIC
  Filled 2021-09-17: qty 4

## 2021-09-17 MED ORDER — KETOROLAC TROMETHAMINE 0.5 % OP SOLN: 1.0000 [drp] | Freq: Four times a day (QID) | OPHTHALMIC | 0 refills | Status: DC

## 2021-09-17 MED ORDER — FLUORESCEIN SODIUM 1 MG OP STRP
1.0000 | ORAL_STRIP | Freq: Once | OPHTHALMIC | Status: AC
  Administered 2021-09-17: 1 via OPHTHALMIC
  Filled 2021-09-17: qty 1

## 2021-09-17 NOTE — ED Triage Notes (Signed)
Pt reports was playing around with her dog last pm and it scratched her in the left eye. Pt reports redness, drainage and discomfort to left eye since then. Pt reports vision is blurry

## 2021-09-17 NOTE — ED Provider Notes (Signed)
[facility] Emergency Department Provider Note  ____________________________________________  Time seen: Approximately 11:22 AM  I have reviewed the triage vital signs and the nursing notes.   HISTORY  Chief Complaint Eye Problem    HPI Brandi Valdez is a 40 y.o. female who presents the emergency department complaining of left eye pain.  Patient states that she was accidentally scratched in the left eye by her dog's paw last night.  Patient was hoping that the pain and excess tearing would resolve by this morning but had not so she presents for evaluation.  She does not wear glasses or contacts.  No visual changes.  Patient has a foreign body sensation to the left eye.       Past Medical History:  Diagnosis Date   ADD (attention deficit disorder)    Family history of breast cancer    Menorrhagia with regular cycle     Patient Active Problem List   Diagnosis Date Noted   Tobacco dependence 03/21/2017    Past Surgical History:  Procedure Laterality Date   CESAREAN SECTION     DILATION AND CURETTAGE OF UTERUS  04/08/2007   missed AB   HYSTEROSCOPY W/ ENDOMETRIAL ABLATION  03/22/2017   with [facility]-Dr Tiburcio Pea   LASIK     TUBAL LIGATION      Prior to Admission medications   Medication Sig Start Date End Date Taking? Authorizing Provider  ketorolac (ACULAR) 0.5 % ophthalmic solution Place 1 drop into the left eye 4 (four) times daily. 09/17/21  Yes Shariq Puig, Delorise Royals, PA-C  trimethoprim-polymyxin b (POLYTRIM) ophthalmic solution Place 2 drops into the left eye every 6 (six) hours. 09/17/21  Yes Ronson Hagins, Delorise Royals, PA-C  lisdexamfetamine (VYVANSE) 10 MG capsule     [provider]  sertraline (ZOLOFT) 50 MG tablet  02/18/21   [provider]    Allergies Patient has no known allergies.  Family History  Problem Relation Age of Onset   Breast cancer Mother 94   Hypertension Mother    Lymphoma Paternal Aunt    Colon  cancer Maternal Grandmother 65   Breast cancer Paternal Grandmother 80    Social History Social History   Tobacco Use   Smoking status: Every Day    Packs/day: 0.50    Types: Cigarettes   Smokeless tobacco: Never  Vaping Use   Vaping Use: Never used  Substance Use Topics   Alcohol use: No   Drug use: No     Review of Systems  Constitutional: No fever/chills Eyes: No visual changes. No discharge.  Injury to the left eye with foreign body sensation ENT: No upper respiratory complaints. Cardiovascular: no chest pain. Respiratory: no cough. No SOB. Gastrointestinal: No abdominal pain.  No nausea, no vomiting.  No diarrhea.  No constipation. Musculoskeletal: Negative for musculoskeletal pain. Skin: Negative for rash, abrasions, lacerations, ecchymosis. Neurological: Negative for headaches, focal weakness or numbness.  10 System ROS otherwise negative.  ____________________________________________   PHYSICAL EXAM:  VITAL SIGNS: ED Triage Vitals  Enc Vitals Group     BP 09/17/21 1102 (!) 140/100     Pulse Rate 09/17/21 1102 (!) 108     Resp 09/17/21 1102 20     Temp 09/17/21 1102 98.3 F (36.8 C)     Temp Source 09/17/21 1102 Oral     SpO2 09/17/21 1102 96 %     Weight 09/17/21 1101 117 lb (53.1 kg)     Height 09/17/21 1101 5\' 2"  (1.575 m)  Head Circumference --      Peak Flow --      Pain Score 09/17/21 1101 7     Pain Loc --      Pain Edu? --      Excl. in GC? --      Constitutional: Alert and oriented. Well appearing and in no acute distress. Eyes: Conjunctivae are normal. PERRL. EOMI. visualization of the left eye reveals slight erythema of the conjunctivo when compared with right.  This appears more to be from rubbing versus true conjunctival irritation/infection.  Funduscopic exam reveals red reflex bilaterally.  Vasculature and optic disc is unremarkable bilaterally.  Eye is anesthetized using tetracaine drops.  Fluorescein staining applied with area of  uptake over the pupil itself.  There is no appreciable foreign body. Head: Atraumatic. ENT:      Ears:       Nose: No congestion/rhinnorhea.      Mouth/Throat: Mucous membranes are moist.  Neck: No stridor.    Cardiovascular: Normal rate, regular rhythm. Normal S1 and S2.  Good peripheral circulation. Respiratory: Normal respiratory effort without tachypnea or retractions. Lungs CTAB. Good air entry to the bases with no decreased or absent breath sounds. Musculoskeletal: Full range of motion to all extremities. No gross deformities appreciated. Neurologic:  Normal speech and language. No gross focal neurologic deficits are appreciated.  Skin:  Skin is warm, dry and intact. No rash noted. Psychiatric: Mood and affect are normal. Speech and behavior are normal. Patient exhibits appropriate insight and judgement.   ____________________________________________   LABS (all labs ordered are listed, but only abnormal results are displayed)  Labs Reviewed - No data to display ____________________________________________  EKG   ____________________________________________  RADIOLOGY   No results found.  ____________________________________________    PROCEDURES  Procedure(s) performed:    Procedures    Medications  tetracaine (PONTOCAINE) 0.5 % ophthalmic solution 2 drop (2 drops Left Eye Given by Other 09/17/21 1138)  fluorescein ophthalmic strip 1 strip (1 strip Left Eye Given by Other 09/17/21 1138)     ____________________________________________   INITIAL IMPRESSION / ASSESSMENT AND PLAN / ED COURSE  Pertinent labs & imaging results that were available during my care of the patient were reviewed by me and considered in my medical decision making (see chart for details).  Review of the [place] CSRS was performed in accordance of the [facility] prior to dispensing any controlled drugs.           Patient's diagnosis is consistent with corneal abrasion.  Patient  presents the emergency department after being scratched in the left eye by [facility] for dog.  This occurred last night.  She had a foreign body sensation, irritation to the left eye.  She presents this morning for evaluation and exam reveals area of uptake with fluorescein staining consistent with corneal abrasion.  Patiently placed on antibiotic eyedrops and Acular for symptom relief.  Follow-up with ophthalmology as needed.  Return precautions discussed with the patient..  Patient is given ED precautions to return to the ED for any worsening or new symptoms.     ____________________________________________  FINAL CLINICAL IMPRESSION(S) / ED DIAGNOSES  Final diagnoses:  Abrasion of left cornea, initial encounter      NEW MEDICATIONS STARTED DURING THIS VISIT:  ED Discharge Orders          Ordered    trimethoprim-polymyxin b (POLYTRIM) ophthalmic solution  Every 6 hours        09/17/21 1201    ketorolac (ACULAR)  0.5 % ophthalmic solution  4 times daily        09/17/21 1201                This chart was dictated using voice recognition software/Dragon. Despite best efforts to proofread, errors can occur which can change the meaning. Any change was purely unintentional.    Racheal Patches, PA-C 09/17/21 1201    Minna Antis, MD 09/17/21 1705

## 2021-09-17 NOTE — ED Notes (Signed)
See triage note  presents with injury to left eye  states her dog scratched her left eye last pm

## 2021-09-17 NOTE — ED Notes (Signed)
T was advised by provider that she may need to be seen in the ED due to her significant injury to her eye.

## 2022-04-17 ENCOUNTER — Ambulatory Visit
Admission: EM | Admit: 2022-04-17 | Discharge: 2022-04-17 | Disposition: A | Discharge status: Home / Self Care | Payer: BC Managed Care – PPO | Attending: Emergency Medicine | Admitting: Emergency Medicine

## 2022-04-17 DIAGNOSIS — R300 Dysuria: Secondary | ICD-10-CM | POA: Diagnosis not present

## 2022-04-17 LAB — POCT URINALYSIS DIP (MANUAL ENTRY)
Bilirubin, UA: NEGATIVE
Glucose, UA: NEGATIVE mg/dL
Ketones, POC UA: NEGATIVE mg/dL
Nitrite, UA: NEGATIVE
Protein Ur, POC: 30 mg/dL — AB
Spec Grav, UA: 1.01 (ref 1.010–1.025)
Urobilinogen, UA: 0.2 E.U./dL
pH, UA: 7 (ref 5.0–8.0)

## 2022-04-17 MED ORDER — CEPHALEXIN 500 MG PO CAPS: 500.0000 mg | ORAL_CAPSULE | Freq: Two times a day (BID) | ORAL | 0 refills | Status: AC

## 2022-04-17 NOTE — ED Triage Notes (Signed)
Patient presents to Urgent Care with complaints of urinary freq and flank pain since Sunday. Taking advil for pain.  ?

## 2022-04-17 NOTE — ED Provider Notes (Signed)
?[facility] ? ? ? ?CSN: [id number] ?Arrival date & time: 04/17/22  0856 ? ? ?  ? ?History   ?Chief Complaint ?Chief Complaint  ?Patient presents with  ? Urinary Frequency  ?  Entered by patient  ? ? ?HPI ?Brandi Valdez is a 41 y.o. female.  Patient presents with dysuria, urinary frequency, low back pain x2 days.  Treatment at home with ibuprofen.  No fever, abdominal pain, hematuria, vaginal discharge, pelvic pain, or other symptoms.  ? ?The history is provided by the patient and medical records.  ? ?Past Medical History:  ?Diagnosis Date  ? ADD (attention deficit disorder)   ? Family history of breast cancer   ? Menorrhagia with regular cycle   ? ? ?Patient Active Problem List  ? Diagnosis Date Noted  ? Tobacco dependence 03/21/2017  ? ? ?Past Surgical History:  ?Procedure Laterality Date  ? CESAREAN SECTION    ? DILATION AND CURETTAGE OF UTERUS  04/08/2007  ? missed AB  ? HYSTEROSCOPY W/ ENDOMETRIAL ABLATION  03/22/2017  ? with [facility]-Dr Kenton Kingfisher  ? LASIK    ? TUBAL LIGATION    ? ? ?OB History   ? ? Gravida  ?5  ? Para  ?3  ? Term  ?3  ? Preterm  ?   ? AB  ?2  ? Living  ?3  ?  ? ? SAB  ?2  ? IAB  ?   ? Ectopic  ?   ? Multiple  ?   ? Live Births  ?3  ?   ?  ?  ? ? ? ?Home Medications   ? ?Prior to Admission medications   ?Medication Sig Start Date End Date Taking? Authorizing Provider  ?cephALEXin (KEFLEX) 500 MG capsule Take 1 capsule (500 mg total) by mouth 2 (two) times daily for 5 days. 04/17/22 04/22/22 Yes Sharion Balloon, NP  ?ketorolac (ACULAR) 0.5 % ophthalmic solution Place 1 drop into the left eye 4 (four) times daily. 09/17/21   Cuthriell, Charline Bills, PA-C  ?lisdexamfetamine (VYVANSE) 10 MG capsule     [provider]  ?sertraline (ZOLOFT) 50 MG tablet  02/18/21   [provider]  ?trimethoprim-polymyxin b (POLYTRIM) ophthalmic solution Place 2 drops into the left eye every 6 (six) hours. 09/17/21   Cuthriell, Charline Bills, PA-C  ? ? ?Family History ?Family History  ?Problem  Relation Age of Onset  ? Breast cancer Mother 52  ? Hypertension Mother   ? Lymphoma Paternal Aunt   ? Colon cancer Maternal Grandmother 90  ? Breast cancer Paternal Grandmother 46  ? ? ?Social History ?Social History  ? ?Tobacco Use  ? Smoking status: Every Day  ?  Packs/day: 0.50  ?  Types: Cigarettes  ? Smokeless tobacco: Never  ?Vaping Use  ? Vaping Use: Never used  ?Substance Use Topics  ? Alcohol use: No  ? Drug use: No  ? ? ? ?Allergies   ?Patient has no known allergies. ? ? ?Review of Systems ?Review of Systems  ?Constitutional:  Negative for chills and fever.  ?Gastrointestinal:  Negative for abdominal pain, diarrhea and vomiting.  ?Genitourinary:  Positive for dysuria and flank pain. Negative for hematuria, pelvic pain and vaginal discharge.  ?Musculoskeletal:  Positive for back pain.  ?Skin:  Negative for color change and rash.  ?All other systems reviewed and are negative. ? ? ?Physical Exam ?Triage Vital Signs ?ED Triage Vitals  ?Enc Vitals Group  ?   BP 04/17/22 0924  128/85  ?   Pulse Rate 04/17/22 0924 84  ?   Resp 04/17/22 0924 18  ?   Temp 04/17/22 0924 97.9 ?F (36.6 ?C)  ?   Temp src --   ?   SpO2 04/17/22 0924 100 %  ?   Weight --   ?   Height --   ?   Head Circumference --   ?   Peak Flow --   ?   Pain Score 04/17/22 0930 7  ?   Pain Loc --   ?   Pain Edu? --   ?   Excl. in [place]? --   ? ?No data found. ? ?Updated Vital Signs ?BP 128/85   Pulse 84   Temp 97.9 ?F (36.6 ?C)   Resp 18   SpO2 100%  ? ?Visual Acuity ?Right Eye Distance:   ?Left Eye Distance:   ?Bilateral Distance:   ? ?Right Eye Near:   ?Left Eye Near:    ?Bilateral Near:    ? ?Physical Exam ?Vitals and nursing note reviewed.  ?Constitutional:   ?   General: She is not in acute distress. ?   Appearance: Normal appearance. She is well-developed. She is not ill-appearing.  ?HENT:  ?   Mouth/Throat:  ?   Mouth: Mucous membranes are moist.  ?Cardiovascular:  ?   Rate and Rhythm: Normal rate and regular rhythm.  ?   Heart sounds: Normal  heart sounds.  ?Pulmonary:  ?   Effort: Pulmonary effort is normal. No respiratory distress.  ?   Breath sounds: Normal breath sounds.  ?Abdominal:  ?   General: Bowel sounds are normal.  ?   Palpations: Abdomen is soft.  ?   Tenderness: There is no abdominal tenderness. There is no right CVA tenderness, left CVA tenderness, guarding or rebound.  ?Musculoskeletal:  ?   Cervical back: Neck supple.  ?Skin: ?   General: Skin is warm and dry.  ?Neurological:  ?   General: No focal deficit present.  ?   Mental Status: She is alert and oriented to person, place, and time.  ?   Gait: Gait normal.  ?Psychiatric:     ?   Mood and Affect: Mood normal.     ?   Behavior: Behavior normal.  ? ? ? ?UC Treatments / Results  ?Labs ?(all labs ordered are listed, but only abnormal results are displayed) ?Labs Reviewed  ?POCT URINALYSIS DIP (MANUAL ENTRY) - Abnormal; Notable for the following components:  ?    Result Value  ? Blood, UA moderate (*)   ? Protein Ur, POC =30 (*)   ? Leukocytes, UA Small (1+) (*)   ? All other components within normal limits  ?URINE CULTURE  ? ? ?EKG ? ? ?Radiology ?No results found. ? ?Procedures ?Procedures (including critical care time) ? ?Medications Ordered in UC ?Medications - No data to display ? ?Initial Impression / Assessment and Plan / UC Course  ?I have reviewed the triage vital signs and the nursing notes. ? ?Pertinent labs & imaging results that were available during my care of the patient were reviewed by me and considered in my medical decision making (see chart for details). ? ?  ?Dysuria.  Treating with Keflex. Urine culture pending. Discussed with patient that we will call her if the urine culture shows the need to change or discontinue the antibiotic. Instructed her to follow-up with her PCP if her symptoms are not improving. Patient agrees to plan of  care.    ? ?Final Clinical Impressions(s) / UC Diagnoses  ? ?Final diagnoses:  ?Dysuria  ? ? ? ?Discharge Instructions   ? ?  ?Take the  antibiotic as directed.  The urine culture is pending.  We will call you if it shows the need to change or discontinue your antibiotic.   ? ?Follow up with your primary care provider if your symptoms are not improving.   ? ? ? ? ? ?ED Prescriptions   ? ? Medication Sig Dispense Auth. Provider  ? cephALEXin (KEFLEX) 500 MG capsule Take 1 capsule (500 mg total) by mouth 2 (two) times daily for 5 days. 10 capsule Sharion Balloon, NP  ? ?  ? ?PDMP not reviewed this encounter. ?  ?Sharion Balloon, NP ?04/17/22 1006 ? ?

## 2022-04-17 NOTE — Discharge Instructions (Addendum)
Take the antibiotic as directed.  The urine culture is pending.  We will call you if it shows the need to change or discontinue your antibiotic.    Follow up with your primary care provider if your symptoms are not improving.    

## 2022-04-19 LAB — URINE CULTURE: Culture: 80000 — AB

## 2022-04-28 ENCOUNTER — Ambulatory Visit: Payer: BC Managed Care – PPO

## 2022-04-28 ENCOUNTER — Telehealth: Payer: Self-pay | Admitting: Family Medicine

## 2022-04-28 ENCOUNTER — Telehealth: Payer: BC Managed Care – PPO | Admitting: Nurse Practitioner

## 2022-04-28 DIAGNOSIS — Z789 Other specified health status: Secondary | ICD-10-CM

## 2022-04-28 DIAGNOSIS — R399 Unspecified symptoms and signs involving the genitourinary system: Secondary | ICD-10-CM

## 2022-04-28 DIAGNOSIS — M545 Low back pain, unspecified: Secondary | ICD-10-CM

## 2022-04-28 DIAGNOSIS — N390 Urinary tract infection, site not specified: Secondary | ICD-10-CM

## 2022-04-28 MED ORDER — CIPROFLOXACIN HCL 500 MG PO TABS: 500.0000 mg | ORAL_TABLET | Freq: Two times a day (BID) | ORAL | 0 refills | Status: AC

## 2022-04-28 NOTE — Telephone Encounter (Signed)
Patient seen one week ago and completed 5 day of keflex. Symptoms remain present. Per urine culture treating with Cipro BID x 5 days. Patient negative for Urosepsis symptoms.  Advised if symptoms do not readily improve, following treatment she will need to be seen back in clinic

## 2022-04-28 NOTE — Progress Notes (Signed)
Based on what you shared with me it looks like you have uti symptoms with back pain,that should be evaluated in a face to face office visit. Due to the associating back pain you will need a urinalysis and urine culture for proper treatment. NOTE: There will be NO CHARGE for this eVisit   If you are having a true medical emergency please call 911.      For an urgent face to face visit, [facility] has six urgent care centers for your convenience:     [facility] [place] Get Driving Directions [phone] [address] [place], [place] [address]    [facility] ([place]) Get Driving Directions [phone] [address] [place], [place] [address]  [facility] ([place]) Get Driving Directions [phone] [address] [place],  [place]  [address]  [facility] Get Driving Directions [phone] [address] [place], [place] [address]   [facility] [place] Get Driving Directions  [phone] [address] [place], [place] [address]   [facility] [place] Get Driving Directions [phone] [address] [place], [place] [address]  Your MyChart E-visit questionnaire answers were reviewed by a board certified advanced clinical practitioner to complete your personal care plan based on your specific symptoms.  Thank you for using e-Visits.         

## 2022-06-29 ENCOUNTER — Encounter: Payer: Self-pay | Admitting: Family Medicine

## 2022-06-29 ENCOUNTER — Other Ambulatory Visit (HOSPITAL_COMMUNITY)
Admission: RE | Admit: 2022-06-29 | Discharge: 2022-06-29 | Disposition: A | Discharge status: Home / Self Care | Payer: BC Managed Care – PPO | Source: Ambulatory Visit | Attending: Family Medicine | Admitting: Family Medicine

## 2022-06-29 ENCOUNTER — Ambulatory Visit (INDEPENDENT_AMBULATORY_CARE_PROVIDER_SITE_OTHER): Payer: BC Managed Care – PPO | Admitting: Family Medicine

## 2022-06-29 VITALS — BP 118/74 | Ht 62.0 in | Wt 127.0 lb

## 2022-06-29 DIAGNOSIS — Z01419 Encounter for gynecological examination (general) (routine) without abnormal findings: Secondary | ICD-10-CM | POA: Insufficient documentation

## 2022-06-29 NOTE — Progress Notes (Signed)
   GYNECOLOGY ANNUAL PREVENTATIVE CARE ENCOUNTER NOTE  Subjective:   Brandi Valdez is a 41 y.o. [phone] female here for a routine annual gynecologic exam.  Current complaints: No concerns about health today Feeling well, no bleeding since ablation.   Denies abnormal vaginal bleeding, discharge, pelvic pain, problems with intercourse or other gynecologic concerns.    Gynecologic History No LMP recorded. Patient has had an ablation. Contraception: tubal ligation Last Pap: 2020. Results were: normal Last mammogram: 2021. Results were: normal  Health Maintenance Due  Topic Date Due   HIV Screening  Never done   Hepatitis C Screening  Never done   TETANUS/TDAP  Never done   COVID-19 Vaccine (3 - Pfizer series) 05/04/2020   MAMMOGRAM  06/24/2022   PAP SMEAR-Modifier  06/14/2022    The following portions of the patient's history were reviewed and updated as appropriate: allergies, current medications, past family history, past medical history, past social history, past surgical history and problem list.  Review of Systems Pertinent items are noted in HPI.   Objective:  BP 118/74   Ht 5\' 2"  (1.575 m)   Wt 127 lb (57.6 kg)   BMI 23.23 kg/m  CONSTITUTIONAL: Well-developed, well-nourished female in no acute distress.  HENT:  Normocephalic, atraumatic, External right and left ear normal. Oropharynx is clear and moist EYES:  No scleral icterus.  NECK: Normal range of motion, supple, no masses.  Normal thyroid.  SKIN: Skin is warm and dry. No rash noted. Not diaphoretic. No erythema. No pallor. NEUROLOGIC: Alert and oriented to person, place, and time. Normal reflexes, muscle tone coordination. No cranial nerve deficit noted. PSYCHIATRIC: Normal mood and affect. Normal behavior. Normal judgment and thought content. CARDIOVASCULAR: Normal heart rate noted, regular rhythm. 2+ distal pulses. RESPIRATORY: Effort and breath sounds normal, no problems with respiration noted. BREASTS:  Symmetric in size. No masses, skin changes, nipple drainage, or lymphadenopathy. ABDOMEN: Soft,  no distention noted.  No tenderness, rebound or guarding.  PELVIC: Normal appearing external genitalia; normal appearing vaginal mucosa and cervix.  No abnormal discharge noted.  Pap smear obtained.  Normal uterine size, no other palpable masses, no uterine or adnexal tenderness. MUSCULOSKELETAL: Normal range of motion.    Assessment and Plan:  1) Annual gynecologic examination with pap smear:  Will follow up results of pap smear and manage accordingly. STI screening desired No.  Routine preventative health maintenance measures emphasized. Reviewed perimenopausal symptoms and management.   2) Contraception counseling: NA has permanent sterilization  1. Well woman exam with routine gynecological exam S/p BTS and Novasure ablation Reports no recent changes to health  Has PCP that follows annual blood work Has mammogram 2 year ago but not last year. Has family history of breast cancer Will need CRC screening at 28 - MM 3D SCREEN BREAST BILATERAL; Future - Cytology - PAP   Please refer to After Visit Summary for other counseling recommendations.   Return in about 1 year (around 06/30/2023) for Yearly wellness exam.  07/02/2023, MD, MPH, ABFM Attending [facility] for [facility]

## 2022-07-03 LAB — CYTOLOGY - PAP
Comment: NEGATIVE
Diagnosis: NEGATIVE
High risk HPV: NEGATIVE

## 2022-07-13 ENCOUNTER — Ambulatory Visit
Admission: RE | Admit: 2022-07-13 | Discharge: 2022-07-13 | Disposition: A | Discharge status: Home / Self Care | Payer: BC Managed Care – PPO | Source: Ambulatory Visit | Attending: Family Medicine | Admitting: Family Medicine

## 2022-07-13 DIAGNOSIS — Z01419 Encounter for gynecological examination (general) (routine) without abnormal findings: Secondary | ICD-10-CM

## 2022-07-13 DIAGNOSIS — Z1231 Encounter for screening mammogram for malignant neoplasm of breast: Secondary | ICD-10-CM | POA: Diagnosis not present

## 2023-07-17 ENCOUNTER — Ambulatory Visit: Payer: BC Managed Care – PPO | Admitting: Advanced Practice Midwife

## 2023-08-29 ENCOUNTER — Other Ambulatory Visit: Payer: Self-pay | Admitting: Family Medicine

## 2023-08-29 DIAGNOSIS — Z1231 Encounter for screening mammogram for malignant neoplasm of breast: Secondary | ICD-10-CM

## 2023-09-05 ENCOUNTER — Ambulatory Visit
Admission: RE | Admit: 2023-09-05 | Discharge: 2023-09-05 | Disposition: A | Discharge status: Home / Self Care | Payer: BC Managed Care – PPO | Source: Ambulatory Visit | Attending: Family Medicine | Admitting: Family Medicine

## 2023-09-05 DIAGNOSIS — Z1231 Encounter for screening mammogram for malignant neoplasm of breast: Secondary | ICD-10-CM | POA: Diagnosis present

## 2024-09-16 ENCOUNTER — Other Ambulatory Visit: Payer: Self-pay | Admitting: Family Medicine

## 2024-09-16 DIAGNOSIS — Z1231 Encounter for screening mammogram for malignant neoplasm of breast: Secondary | ICD-10-CM

## 2024-10-01 ENCOUNTER — Ambulatory Visit
Admission: RE | Admit: 2024-10-01 | Discharge: 2024-10-01 | Disposition: A | Discharge status: Home / Self Care | Payer: Self-pay | Source: Ambulatory Visit | Attending: Family Medicine | Admitting: Family Medicine

## 2024-10-01 DIAGNOSIS — Z1231 Encounter for screening mammogram for malignant neoplasm of breast: Secondary | ICD-10-CM | POA: Diagnosis present
